# Patient Record
Sex: Female | Born: 1960 | Race: White | Hispanic: No | Marital: Married | Smoking: Former smoker
Health system: Southern US, Community
[De-identification: ages and names within clinical notes are randomized; demographics above are authoritative.]

## PROBLEM LIST (undated history)

## (undated) DIAGNOSIS — R519 Headache, unspecified: Secondary | ICD-10-CM

## (undated) DIAGNOSIS — E785 Hyperlipidemia, unspecified: Secondary | ICD-10-CM

## (undated) DIAGNOSIS — M797 Fibromyalgia: Secondary | ICD-10-CM

## (undated) DIAGNOSIS — R112 Nausea with vomiting, unspecified: Secondary | ICD-10-CM

## (undated) DIAGNOSIS — I10 Essential (primary) hypertension: Secondary | ICD-10-CM

## (undated) DIAGNOSIS — E039 Hypothyroidism, unspecified: Secondary | ICD-10-CM

## (undated) DIAGNOSIS — G7000 Myasthenia gravis without (acute) exacerbation: Secondary | ICD-10-CM

## (undated) DIAGNOSIS — R51 Headache: Secondary | ICD-10-CM

## (undated) HISTORY — DX: Hyperlipidemia, unspecified: E78.5

## (undated) HISTORY — DX: Headache: R51

## (undated) HISTORY — PX: MASTECTOMY: SHX3

## (undated) HISTORY — PX: BREAST SURGERY: SHX581

## (undated) HISTORY — DX: Essential (primary) hypertension: I10

## (undated) HISTORY — PX: REDUCTION MAMMAPLASTY: SUR839

## (undated) HISTORY — DX: Fibromyalgia: M79.7

## (undated) HISTORY — DX: Hypothyroidism, unspecified: E03.9

## (undated) HISTORY — DX: Headache, unspecified: R51.9

---

## 2006-05-30 DIAGNOSIS — D0512 Intraductal carcinoma in situ of left breast: Secondary | ICD-10-CM

## 2006-05-30 HISTORY — DX: Intraductal carcinoma in situ of left breast: D05.12

## 2006-10-12 ENCOUNTER — Encounter: Admission: RE | Admit: 2006-10-12 | Discharge: 2006-10-12 | Payer: Self-pay | Admitting: Obstetrics and Gynecology

## 2006-10-31 ENCOUNTER — Encounter (INDEPENDENT_AMBULATORY_CARE_PROVIDER_SITE_OTHER): Payer: Self-pay | Admitting: Diagnostic Radiology

## 2006-10-31 ENCOUNTER — Encounter: Admission: RE | Admit: 2006-10-31 | Discharge: 2006-10-31 | Payer: Self-pay | Admitting: Obstetrics and Gynecology

## 2006-11-09 ENCOUNTER — Encounter: Admission: RE | Admit: 2006-11-09 | Discharge: 2006-11-09 | Payer: Self-pay | Admitting: Obstetrics and Gynecology

## 2006-11-16 ENCOUNTER — Encounter (INDEPENDENT_AMBULATORY_CARE_PROVIDER_SITE_OTHER): Payer: Self-pay | Admitting: Diagnostic Radiology

## 2006-11-16 ENCOUNTER — Encounter: Admission: RE | Admit: 2006-11-16 | Discharge: 2006-11-16 | Payer: Self-pay | Admitting: General Surgery

## 2006-12-15 ENCOUNTER — Encounter (INDEPENDENT_AMBULATORY_CARE_PROVIDER_SITE_OTHER): Payer: Self-pay | Admitting: General Surgery

## 2006-12-15 ENCOUNTER — Ambulatory Visit (HOSPITAL_COMMUNITY): Admission: RE | Admit: 2006-12-15 | Discharge: 2006-12-16 | Payer: Self-pay | Admitting: General Surgery

## 2007-01-09 ENCOUNTER — Ambulatory Visit: Payer: Self-pay | Admitting: Oncology

## 2007-02-08 LAB — CBC WITH DIFFERENTIAL/PLATELET
BASO%: 0.3 % (ref 0.0–2.0)
Basophils Absolute: 0 10*3/uL (ref 0.0–0.1)
MONO#: 0.5 10*3/uL (ref 0.1–0.9)
MONO%: 5.6 % (ref 0.0–13.0)
NEUT#: 4 10*3/uL (ref 1.5–6.5)
NEUT%: 46.7 % (ref 39.6–76.8)
RDW: 12.3 % (ref 11.3–14.5)
WBC: 8.5 10*3/uL (ref 3.9–10.0)
lymph#: 3.9 10*3/uL — ABNORMAL HIGH (ref 0.9–3.3)

## 2007-02-08 LAB — COMPREHENSIVE METABOLIC PANEL
ALT: 11 U/L (ref 0–35)
AST: 13 U/L (ref 0–37)
Albumin: 4.7 g/dL (ref 3.5–5.2)
CO2: 21 mEq/L (ref 19–32)
Calcium: 9.4 mg/dL (ref 8.4–10.5)
Chloride: 108 mEq/L (ref 96–112)
Creatinine, Ser: 0.93 mg/dL (ref 0.40–1.20)
Total Protein: 7.1 g/dL (ref 6.0–8.3)

## 2007-02-20 ENCOUNTER — Ambulatory Visit: Admission: RE | Admit: 2007-02-20 | Discharge: 2007-03-27 | Payer: Self-pay | Admitting: Radiation Oncology

## 2007-04-09 ENCOUNTER — Ambulatory Visit: Payer: Self-pay | Admitting: Oncology

## 2007-05-21 ENCOUNTER — Ambulatory Visit: Payer: Self-pay | Admitting: Oncology

## 2007-09-13 IMAGING — CR DG CHEST 2V
2 series · 2 of 2 positions shown · non-contrast
Comparison: None.

CLINICAL DATA: Pre-admit for left breast cancer.
 CHEST - 2 VIEW:

[view not recorded (1 of 2)]
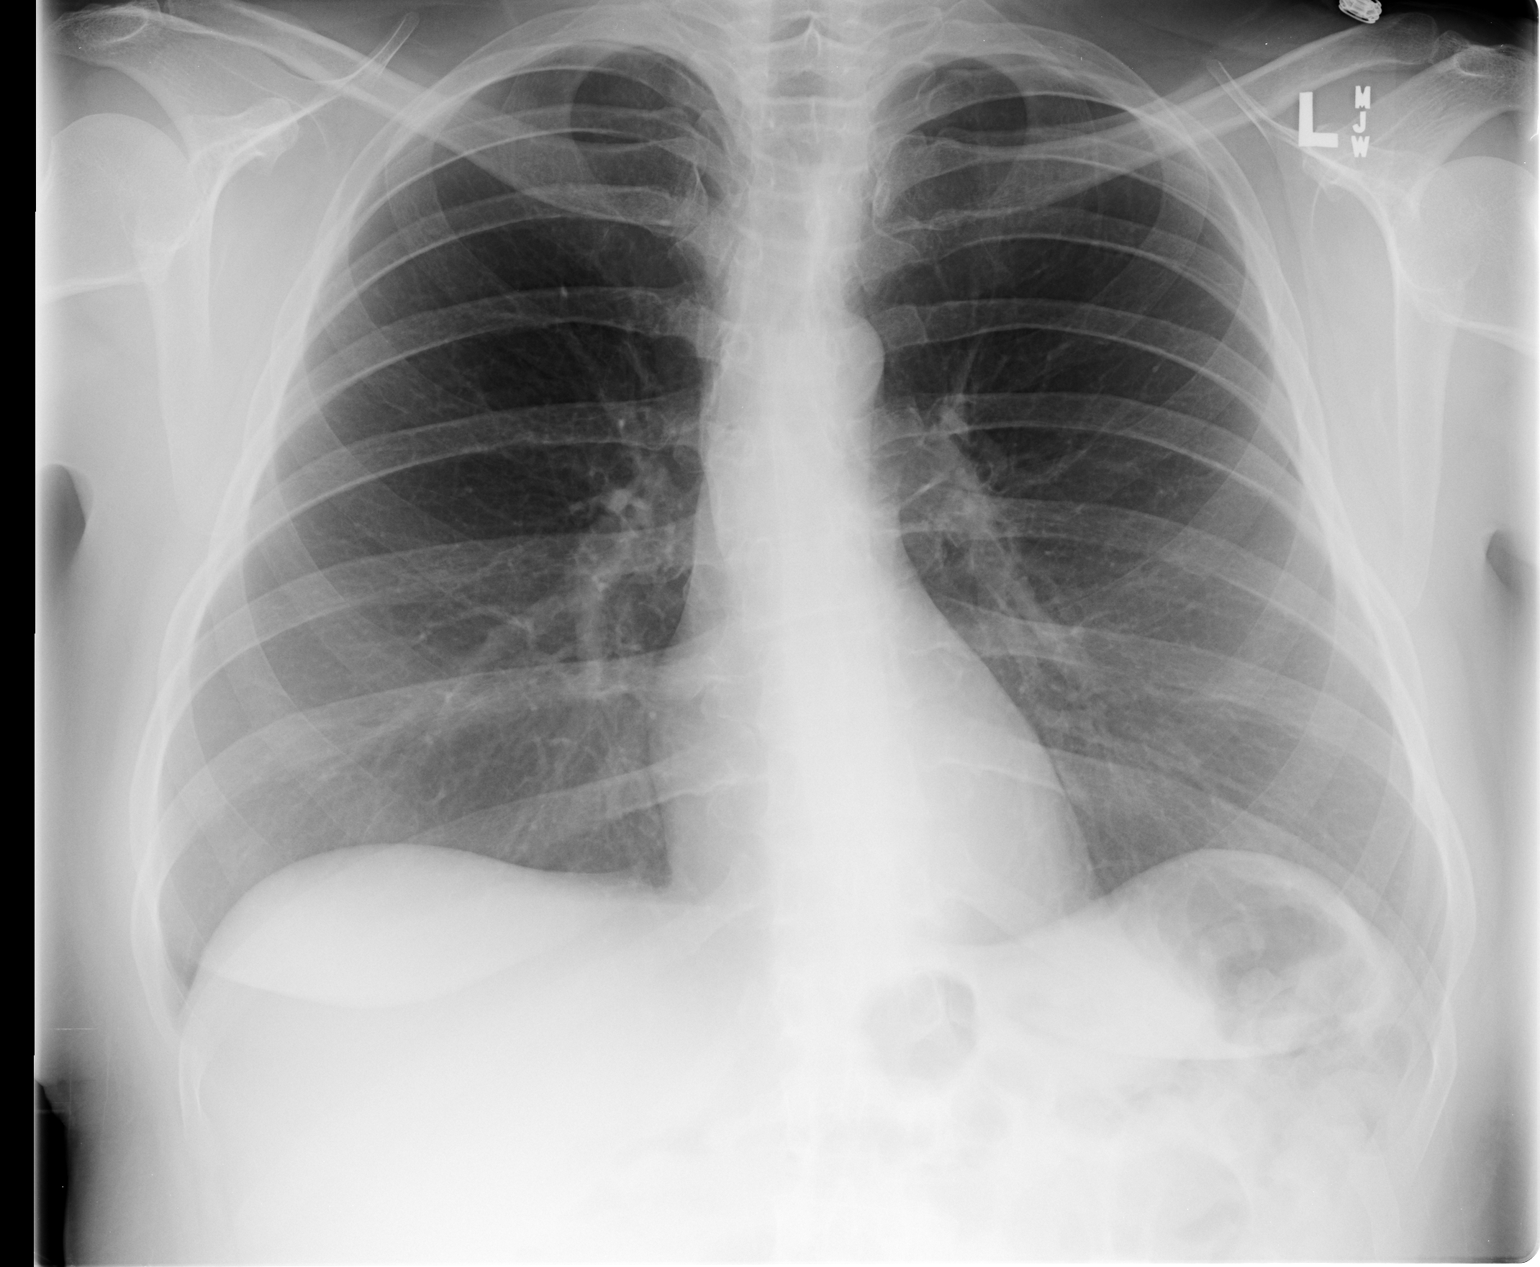

[view not recorded (2 of 2)]
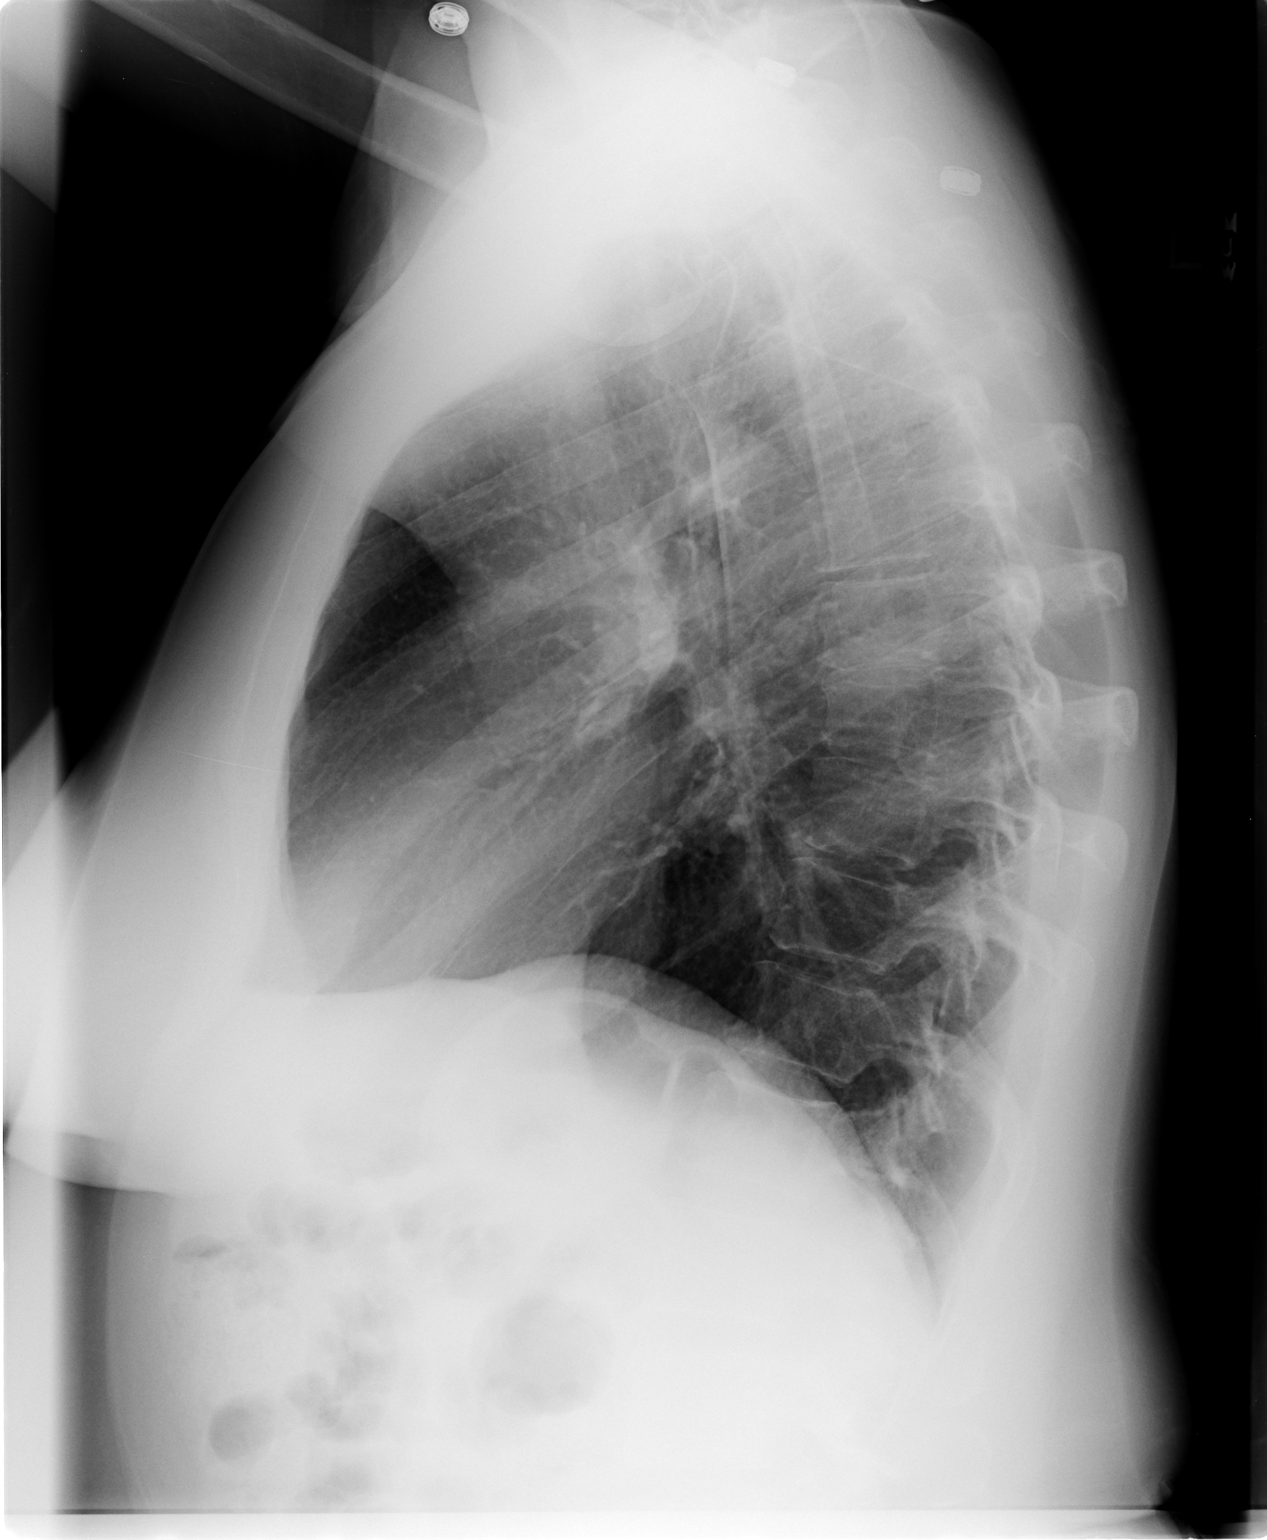

[2 of 2 positions shown; findings below may reference images not displayed]

FINDINGS: The heart size and mediastinal contours are within normal limits.  Both lungs are clear.  The visualized skeletal structures are unremarkable.
IMPRESSION: No active cardiopulmonary disease.

## 2007-11-01 ENCOUNTER — Encounter: Admission: RE | Admit: 2007-11-01 | Discharge: 2007-11-01 | Payer: Self-pay | Admitting: Obstetrics and Gynecology

## 2008-11-10 ENCOUNTER — Encounter: Admission: RE | Admit: 2008-11-10 | Discharge: 2008-11-10 | Payer: Self-pay | Admitting: Obstetrics and Gynecology

## 2009-11-17 ENCOUNTER — Encounter: Admission: RE | Admit: 2009-11-17 | Discharge: 2009-11-17 | Payer: Self-pay | Admitting: Obstetrics and Gynecology

## 2010-10-12 NOTE — Op Note (Signed)
NAME:  Chelsey Thompson, Chelsey Thompson                ACCOUNT NO.:  [id number]   MEDICAL RECORD NO.:  [id number]          PATIENT TYPE:  AMB   LOCATION:  SDS                          FACILITY:  [facility]   PHYSICIAN:  Ollen Gross. Vernell Morgans, M.D. DATE OF BIRTH:  02/19/1961   DATE OF PROCEDURE:  12/15/2006  DATE OF DISCHARGE:                               OPERATIVE REPORT   PREOPERATIVE DIAGNOSIS:  Left breast ductal carcinoma in situ,  extensive.   POSTOPERATIVE DIAGNOSIS:  Left breast ductal carcinoma in situ,  extensive.   PROCEDURE:  Left mastectomy with sentinel node biopsy x2 and injection  of blue dye.   SURGEON:  Ollen Gross. Vernell Morgans, M.D.   ANESTHESIA:  General.   PROCEDURE IN DETAIL:  After informed consent was obtained, the patient  was brought to the operating room, and placed in the supine position on  the operating table.  After induction of general anesthesia, the  patient's left breast and axilla were prepped with Betadine and draped  in the usual sterile manner.  2 mL of methylene blue and 3 mL of  injectable saline were then injected in the subareolar position.  Earlier in the day, the patient had undergone injection of 1 mCi of  technetium sulfur colloid in the subareolar area.  The breast was  massaged for several minutes.  Using the NeoProbe, a hot spot was  identified in the left axilla.  A small transverse incision was made  overlying this hot spot.  This incision was carried down through the  skin and subcutaneous tissue sharply with electrocautery until the  axilla was entered.  Blunt dissection was then carried out as directed  by the NeoProbe until a lymph node was identified.  It was hot but not  blue and had counts of about 150. It was dissected from the rest of the  axillary tissue by sharp dissection with electrocautery and sent to  pathology as sentinel node #1. Another lymph node was then identified,  it was hot and blue, it was separated from the rest of the subcutaneous  tissue by a combination of sharp dissection with electrocautery and then  the lymphatics were clamped with hemostats, divided and ligated with 3-0  Vicryl ties.  Once this was accomplished, there was no further activity  appreciable in the axilla and no palpable adenopathy. The deep layer of  the incision was closed with interrupted 3-0 Vicryl stitches and the  skin was closed with a running 4-0 Monocryl subcuticular stitch.   Next, attention was turned to the left breast.  An elliptical incision  was made around the nipple areolar complex.  This incision was carried  down through the skin and subcutaneous tissue sharply with  electrocautery.  Breast hooks were then used to elevate the skin flaps  superiorly and inferiorly towards the ceiling.  Thin skin flaps were  then created by sharp dissection with electrocautery and gentle traction  on the breast.  The skin flaps were taken all the way down to the chest  wall circumferentially.  Once this was accomplished, the breast was then  removed from the chest wall along with the pectoralis fascia sharply  with electrocautery.  The breast was then divided at the lateral aspect  of the axilla and removed. A stitch was used to mark the lateral aspect  of the breast and the breast was sent to pathology.  The wound was  irrigated with copious amounts of saline.  Hemostasis was achieved using  Bovie electrocautery.  A small stab incision was made in the anterior  axillary line just below the operative field.  A hemostat was brought  through this opening and a 19 [place] round Blake drain was brought  through this opening into the field.  The drain was placed along the  chest wall.  The chest wall was anchored to the pectoralis muscle with  interrupted 3-0 Vicryl stitch.  The superior and inferior edges of the  flaps were then also reapproximated with interrupted 3-0 Vicryl stitches  and the skin was closed with a running 4-0 Monocryl subcuticular  stitch.   A Dermabond dressing was applied and a drain was anchored to the skin  with a 3-0 nylon stitch and placed to bulb suction.  At the end of the  case, all needle, sponge, and instrument counts were correct.  The  patient was then awakened and taken to recovery in stable condition.  Touch preps on the lymph nodes were negative.      Ollen Gross. Vernell Morgans, M.D.  Electronically Signed     PST/MEDQ  D:  12/15/2006  T:  12/15/2006  Job:  [id number]

## 2010-11-12 ENCOUNTER — Other Ambulatory Visit: Payer: Self-pay | Admitting: Obstetrics and Gynecology

## 2010-11-12 DIAGNOSIS — Z9012 Acquired absence of left breast and nipple: Secondary | ICD-10-CM

## 2010-12-06 ENCOUNTER — Ambulatory Visit
Admission: RE | Admit: 2010-12-06 | Discharge: 2010-12-06 | Disposition: A | Discharge status: Home / Self Care | Payer: BC Managed Care – PPO | Source: Ambulatory Visit | Attending: Obstetrics and Gynecology | Admitting: Obstetrics and Gynecology

## 2010-12-06 DIAGNOSIS — Z9012 Acquired absence of left breast and nipple: Secondary | ICD-10-CM

## 2011-03-14 LAB — DIFFERENTIAL
Basophils Relative: 0
Eosinophils Absolute: 0.1
Lymphocytes Relative: 43
Monocytes Absolute: 0.4
Monocytes Relative: 5

## 2011-03-14 LAB — COMPREHENSIVE METABOLIC PANEL
Alkaline Phosphatase: 51
BUN: 9
Chloride: 106
Creatinine, Ser: 0.89
GFR calc non Af Amer: >60
Total Protein: 6.6

## 2011-03-14 LAB — CBC
Hemoglobin: 14.1
RBC: 4.4

## 2011-07-25 ENCOUNTER — Other Ambulatory Visit: Payer: Self-pay | Admitting: Obstetrics and Gynecology

## 2011-07-25 DIAGNOSIS — N63 Unspecified lump in unspecified breast: Secondary | ICD-10-CM

## 2011-07-25 DIAGNOSIS — Z9012 Acquired absence of left breast and nipple: Secondary | ICD-10-CM

## 2011-07-27 ENCOUNTER — Ambulatory Visit
Admission: RE | Admit: 2011-07-27 | Discharge: 2011-07-27 | Disposition: A | Discharge status: Home / Self Care | Payer: BC Managed Care – PPO | Source: Ambulatory Visit | Attending: Obstetrics and Gynecology | Admitting: Obstetrics and Gynecology

## 2011-07-27 DIAGNOSIS — N63 Unspecified lump in unspecified breast: Secondary | ICD-10-CM

## 2011-07-27 DIAGNOSIS — Z9012 Acquired absence of left breast and nipple: Secondary | ICD-10-CM

## 2012-10-29 ENCOUNTER — Other Ambulatory Visit: Payer: Self-pay

## 2012-10-29 DIAGNOSIS — Z1231 Encounter for screening mammogram for malignant neoplasm of breast: Secondary | ICD-10-CM

## 2012-10-29 DIAGNOSIS — Z9012 Acquired absence of left breast and nipple: Secondary | ICD-10-CM

## 2012-10-29 DIAGNOSIS — Z853 Personal history of malignant neoplasm of breast: Secondary | ICD-10-CM

## 2012-12-17 ENCOUNTER — Ambulatory Visit
Admission: RE | Admit: 2012-12-17 | Discharge: 2012-12-17 | Disposition: A | Discharge status: Home / Self Care | Payer: BC Managed Care – PPO | Source: Ambulatory Visit

## 2012-12-17 DIAGNOSIS — Z1231 Encounter for screening mammogram for malignant neoplasm of breast: Secondary | ICD-10-CM

## 2012-12-17 DIAGNOSIS — Z853 Personal history of malignant neoplasm of breast: Secondary | ICD-10-CM

## 2012-12-17 DIAGNOSIS — Z9012 Acquired absence of left breast and nipple: Secondary | ICD-10-CM

## 2013-08-28 ENCOUNTER — Telehealth: Payer: Self-pay | Admitting: General Practice

## 2013-08-28 NOTE — Telephone Encounter (Signed)
Pt aware  And apt made

## 2013-09-02 ENCOUNTER — Encounter: Payer: Self-pay | Admitting: General Practice

## 2013-09-02 ENCOUNTER — Ambulatory Visit (INDEPENDENT_AMBULATORY_CARE_PROVIDER_SITE_OTHER): Payer: BC Managed Care – PPO | Admitting: General Practice

## 2013-09-02 VITALS — BP 120/80 | HR 72 | Temp 97.0°F | Ht 64.75 in | Wt 184.2 lb

## 2013-09-02 DIAGNOSIS — I10 Essential (primary) hypertension: Secondary | ICD-10-CM

## 2013-09-02 DIAGNOSIS — R519 Headache, unspecified: Secondary | ICD-10-CM | POA: Insufficient documentation

## 2013-09-02 DIAGNOSIS — R51 Headache: Secondary | ICD-10-CM | POA: Insufficient documentation

## 2013-09-02 LAB — POCT CBC
GRANULOCYTE PERCENT: 52.7 % (ref 37–80)
HEMATOCRIT: 40 % (ref 37.7–47.9)
Hemoglobin: 13 g/dL (ref 12.2–16.2)
Lymph, poc: 2.3 (ref 0.6–3.4)
MCH: 30.1 pg (ref 27–31.2)
MCHC: 32.4 g/dL (ref 31.8–35.4)
MCV: 93 fL (ref 80–97)
MPV: 9.8 fL (ref 0–99.8)
POC Granulocyte: 3 (ref 2–6.9)
POC LYMPH PERCENT: 41.2 %L (ref 10–50)
Platelet Count, POC: 222 10*3/uL (ref 142–424)
RBC: 4.3 M/uL (ref 4.04–5.48)
RDW, POC: 12.5 %
WBC: 5.6 10*3/uL (ref 4.6–10.2)

## 2013-09-02 NOTE — Patient Instructions (Signed)
Headaches, Frequently Asked Questions  MIGRAINE HEADACHES  Q: What is migraine? What causes it? How can I treat it?  A: Generally, migraine headaches begin as a dull ache. Then they develop into a constant, throbbing, and pulsating pain. You may experience pain at the temples. You may experience pain at the front or back of one or both sides of the head. The pain is usually accompanied by a combination of:   Nausea.   Vomiting.   Sensitivity to light and noise.  Some people (about 15%) experience an aura (see below) before an attack. The cause of migraine is believed to be chemical reactions in the brain. Treatment for migraine may include over-the-counter or prescription medications. It may also include self-help techniques. These include relaxation training and biofeedback.   Q: What is an aura?  A: About 15% of people with migraine get an "aura". This is a sign of neurological symptoms that occur before a migraine headache. You may see wavy or jagged lines, dots, or flashing lights. You might experience tunnel vision or blind spots in one or both eyes. The aura can include visual or auditory hallucinations (something imagined). It may include disruptions in smell (such as strange odors), taste or touch. Other symptoms include:   Numbness.   A "pins and needles" sensation.   Difficulty in recalling or speaking the correct word.  These neurological events may last as long as 60 minutes. These symptoms will fade as the headache begins.  Q: What is a trigger?  A: Certain physical or environmental factors can lead to or "trigger" a migraine. These include:   Foods.   Hormonal changes.   Weather.   Stress.  It is important to remember that triggers are different for everyone. To help prevent migraine attacks, you need to figure out which triggers affect you. Keep a headache diary. This is a good way to track triggers. The diary will help you talk to your healthcare professional about your condition.  Q: Does  weather affect migraines?  A: Bright sunshine, hot, humid conditions, and drastic changes in barometric pressure may lead to, or "trigger," a migraine attack in some people. But studies have shown that weather does not act as a trigger for everyone with migraines.  Q: What is the link between migraine and hormones?  A: Hormones start and regulate many of your body's functions. Hormones keep your body in balance within a constantly changing environment. The levels of hormones in your body are unbalanced at times. Examples are during menstruation, pregnancy, or menopause. That can lead to a migraine attack. In fact, about three quarters of all women with migraine report that their attacks are related to the menstrual cycle.   Q: Is there an increased risk of stroke for migraine sufferers?  A: The likelihood of a migraine attack causing a stroke is very remote. That is not to say that migraine sufferers cannot have a stroke associated with their migraines. In persons under age 40, the most common associated factor for stroke is migraine headache. But over the course of a person's normal life span, the occurrence of migraine headache may actually be associated with a reduced risk of dying from cerebrovascular disease due to stroke.   Q: What are acute medications for migraine?  A: Acute medications are used to treat the pain of the headache after it has started. Examples over-the-counter medications, NSAIDs, ergots, and triptans.   Q: What are the triptans?  A: Triptans are the newest class of   abortive medications. They are specifically targeted to treat migraine. Triptans are vasoconstrictors. They moderate some chemical reactions in the brain. The triptans work on receptors in your brain. Triptans help to restore the balance of a neurotransmitter called serotonin. Fluctuations in levels of serotonin are thought to be a main cause of migraine.   Q: Are over-the-counter medications for migraine effective?  A:  Over-the-counter, or "OTC," medications may be effective in relieving mild to moderate pain and associated symptoms of migraine. But you should see your caregiver before beginning any treatment regimen for migraine.   Q: What are preventive medications for migraine?  A: Preventive medications for migraine are sometimes referred to as "prophylactic" treatments. They are used to reduce the frequency, severity, and length of migraine attacks. Examples of preventive medications include antiepileptic medications, antidepressants, beta-blockers, calcium channel blockers, and NSAIDs (nonsteroidal anti-inflammatory drugs).  Q: Why are anticonvulsants used to treat migraine?  A: During the past few years, there has been an increased interest in antiepileptic drugs for the prevention of migraine. They are sometimes referred to as "anticonvulsants". Both epilepsy and migraine may be caused by similar reactions in the brain.   Q: Why are antidepressants used to treat migraine?  A: Antidepressants are typically used to treat people with depression. They may reduce migraine frequency by regulating chemical levels, such as serotonin, in the brain.   Q: What alternative therapies are used to treat migraine?  A: The term "alternative therapies" is often used to describe treatments considered outside the scope of conventional Western medicine. Examples of alternative therapy include acupuncture, acupressure, and yoga. Another common alternative treatment is herbal therapy. Some herbs are believed to relieve headache pain. Always discuss alternative therapies with your caregiver before proceeding. Some herbal products contain arsenic and other toxins.  TENSION HEADACHES  Q: What is a tension-type headache? What causes it? How can I treat it?  A: Tension-type headaches occur randomly. They are often the result of temporary stress, anxiety, fatigue, or anger. Symptoms include soreness in your temples, a tightening band-like sensation  around your head (a "vice-like" ache). Symptoms can also include a pulling feeling, pressure sensations, and contracting head and neck muscles. The headache begins in your forehead, temples, or the back of your head and neck. Treatment for tension-type headache may include over-the-counter or prescription medications. Treatment may also include self-help techniques such as relaxation training and biofeedback.  CLUSTER HEADACHES  Q: What is a cluster headache? What causes it? How can I treat it?  A: Cluster headache gets its name because the attacks come in groups. The pain arrives with little, if any, warning. It is usually on one side of the head. A tearing or bloodshot eye and a runny nose on the same side of the headache may also accompany the pain. Cluster headaches are believed to be caused by chemical reactions in the brain. They have been described as the most severe and intense of any headache type. Treatment for cluster headache includes prescription medication and oxygen.  SINUS HEADACHES  Q: What is a sinus headache? What causes it? How can I treat it?  A: When a cavity in the bones of the face and skull (a sinus) becomes inflamed, the inflammation will cause localized pain. This condition is usually the result of an allergic reaction, a tumor, or an infection. If your headache is caused by a sinus blockage, such as an infection, you will probably have a fever. An x-ray will confirm a sinus blockage. Your caregiver's   treatment might include antibiotics for the infection, as well as antihistamines or decongestants.   REBOUND HEADACHES  Q: What is a rebound headache? What causes it? How can I treat it?  A: A pattern of taking acute headache medications too often can lead to a condition known as "rebound headache." A pattern of taking too much headache medication includes taking it more than 2 days per week or in excessive amounts. That means more than the label or a caregiver advises. With rebound  headaches, your medications not only stop relieving pain, they actually begin to cause headaches. Doctors treat rebound headache by tapering the medication that is being overused. Sometimes your caregiver will gradually substitute a different type of treatment or medication. Stopping may be a challenge. Regularly overusing a medication increases the potential for serious side effects. Consult a caregiver if you regularly use headache medications more than 2 days per week or more than the label advises.  ADDITIONAL QUESTIONS AND ANSWERS  Q: What is biofeedback?  A: Biofeedback is a self-help treatment. Biofeedback uses special equipment to monitor your body's involuntary physical responses. Biofeedback monitors:   Breathing.   Pulse.   Heart rate.   Temperature.   Muscle tension.   Brain activity.  Biofeedback helps you refine and perfect your relaxation exercises. You learn to control the physical responses that are related to stress. Once the technique has been mastered, you do not need the equipment any more.  Q: Are headaches hereditary?  A: Four out of five (80%) of people that suffer report a family history of migraine. Scientists are not sure if this is genetic or a family predisposition. Despite the uncertainty, a child has a 50% chance of having migraine if one parent suffers. The child has a 75% chance if both parents suffer.   Q: Can children get headaches?  A: By the time they reach high school, most young people have experienced some type of headache. Many safe and effective approaches or medications can prevent a headache from occurring or stop it after it has begun.   Q: What type of doctor should I see to diagnose and treat my headache?  A: Start with your primary caregiver. Discuss his or her experience and approach to headaches. Discuss methods of classification, diagnosis, and treatment. Your caregiver may decide to recommend you to a headache specialist, depending upon your symptoms or other  physical conditions. Having diabetes, allergies, etc., may require a more comprehensive and inclusive approach to your headache. The National Head[employer] Foundation will provide, upon request, a list of NHF physician members in your state.  Document Released: 08/06/2003 Document Revised: 08/08/2011 Document Reviewed: 01/14/2008  ExitCare Patient Information 2014 [employer]  Hypertension  As your heart beats, it forces blood through your arteries. This force is your blood pressure. If the pressure is too high, it is called hypertension (HTN) or high blood pressure. HTN is dangerous because you may have it and not know it. High blood pressure may mean that your heart has to work harder to pump blood. Your arteries may be narrow or stiff. The extra work puts you at risk for heart disease, stroke, and other problems.   Blood pressure consists of two numbers, a higher number over a lower, 110/72, for example. It is stated as "110 over 72." The ideal is below 120 for the top number (systolic) and under 80 for the bottom (diastolic). Write down your blood pressure today.  You should pay close attention to your blood   pressure if you have certain conditions such as:   Heart failure.   Prior heart attack.   Diabetes   Chronic kidney disease.   Prior stroke.   Multiple risk factors for heart disease.  To see if you have HTN, your blood pressure should be measured while you are seated with your arm held at the level of the heart. It should be measured at least twice. A one-time elevated blood pressure reading (especially in the Emergency Department) does not mean that you need treatment. There may be conditions in which the blood pressure is different between your right and left arms. It is important to see your caregiver soon for a recheck.  Most people have essential hypertension which means that there is not a specific cause. This type of high blood pressure may be lowered by changing lifestyle factors such  as:   Stress.   Smoking.   Lack of exercise.   Excessive weight.   Drug/tobacco/alcohol use.   Eating less salt.  Most people do not have symptoms from high blood pressure until it has caused damage to the body. Effective treatment can often prevent, delay or reduce that damage.  TREATMENT   When a cause has been identified, treatment for high blood pressure is directed at the cause. There are a large number of medications to treat HTN. These fall into several categories, and your caregiver will help you select the medicines that are best for you. Medications may have side effects. You should review side effects with your caregiver.  If your blood pressure stays high after you have made lifestyle changes or started on medicines,    Your medication(s) may need to be changed.   Other problems may need to be addressed.   Be certain you understand your prescriptions, and know how and when to take your medicine.   Be sure to follow up with your caregiver within the time frame advised (usually within two weeks) to have your blood pressure rechecked and to review your medications.   If you are taking more than one medicine to lower your blood pressure, make sure you know how and at what times they should be taken. Taking two medicines at the same time can result in blood pressure that is too low.  SEEK IMMEDIATE MEDICAL CARE IF:   You develop a severe headache, blurred or changing vision, or confusion.   You have unusual weakness or numbness, or a faint feeling.   You have severe chest or abdominal pain, vomiting, or breathing problems.  MAKE SURE YOU:    Understand these instructions.   Will watch your condition.   Will get help right away if you are not doing well or get worse.  Document Released: 05/16/2005 Document Revised: 08/08/2011 Document Reviewed: 01/04/2008  Exit[employer] Patient Information 2014 [employer].

## 2013-09-02 NOTE — Progress Notes (Signed)
   Subjective:    Patient ID: Chelsey Thompson, female    DOB: 04/24/1961, 53 y.o.   MRN: [id number]  HPI Patient presents today to establish care. She was being seen by a internal medicine practice in [place]. History of hypertension and headaches (onset 1.5 years ago and gradually worsening). History of hyperlipidemia, but verbalized she will not take an alternative. Taking Lotrel as prescribed. Denies currently taking prescribed medications for headache. Has taking Topamax in the past, but stopped taking due to feeling like it was ineffective. Also has taking several prescription medications to address headaches, without effectiveness. 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 Reports drinking two large cups of caffeine drinks daily. Walks 30 minutes daily for exercise. Denies being seen by a headache specialist.     Review of Systems  Constitutional: Negative for fever and chills.  Respiratory: Negative for chest tightness and shortness of breath.   Cardiovascular: Negative for chest pain and palpitations.  Gastrointestinal: Negative for nausea, vomiting, abdominal pain, diarrhea, constipation and blood in stool.  Genitourinary: Negative for difficulty urinating.  Neurological: Negative for dizziness, weakness and headaches.       Objective:   Physical Exam  Constitutional: She is oriented to person, place, and time. She appears well-developed and well-nourished.  HENT:  Head: Normocephalic and atraumatic.  Right Ear: External ear normal.  Left Ear: External ear normal.  Mouth/Throat: Oropharynx is clear and moist.  Eyes: EOM are normal. Pupils are equal, round, and reactive to light.  Neck: Normal range of motion. Neck supple. No thyromegaly present.  Cardiovascular: Normal rate, regular rhythm and normal heart sounds.   Pulmonary/Chest: Effort normal and breath sounds normal. No respiratory distress. She exhibits no tenderness.  Abdominal: Soft. Bowel sounds  are normal. She exhibits no distension. There is no tenderness.  Lymphadenopathy:    She has no cervical adenopathy.  Neurological: She is alert and oriented to person, place, and time.  Skin: Skin is warm and dry.  Psychiatric: She has a normal mood and affect.          Assessment & Plan:  1. Hypertension  - POCT CBC - CMP14+EGFR   2. Headache(784.0)  - AMB referral to headache clinic -maintain headache and blood pressure diary -bring to office on next visit -Continue all current medications Labs pending F/u in 3 months Discussed benefits of regular exercise and healthy eating Sign release for medical records Patient verbalized understanding Erby Pian, FNP-C

## 2013-09-03 LAB — CMP14+EGFR
ALT: 12 IU/L (ref 0–32)
AST: 13 IU/L (ref 0–40)
Albumin/Globulin Ratio: 2.9 — ABNORMAL HIGH (ref 1.1–2.5)
Albumin: 5 g/dL (ref 3.5–5.5)
Alkaline Phosphatase: 64 IU/L (ref 39–117)
BUN / CREAT RATIO: 8 — AB (ref 9–23)
BUN: 7 mg/dL (ref 6–24)
CO2: 22 mmol/L (ref 18–29)
CREATININE: 0.89 mg/dL (ref 0.57–1.00)
Calcium: 9.8 mg/dL (ref 8.7–10.2)
Chloride: 103 mmol/L (ref 97–108)
GFR calc non Af Amer: 76 mL/min/{1.73_m2} (ref >59)
GFR, EST AFRICAN AMERICAN: 87 mL/min/{1.73_m2} (ref >59)
Globulin, Total: 1.7 g/dL (ref 1.5–4.5)
Glucose: 88 mg/dL (ref 65–99)
Potassium: 4.9 mmol/L (ref 3.5–5.2)
Sodium: 139 mmol/L (ref 134–144)
Total Bilirubin: 0.4 mg/dL (ref 0.0–1.2)
Total Protein: 6.7 g/dL (ref 6.0–8.5)

## 2013-11-14 ENCOUNTER — Other Ambulatory Visit: Payer: Self-pay

## 2013-11-14 DIAGNOSIS — Z9012 Acquired absence of left breast and nipple: Secondary | ICD-10-CM

## 2013-11-14 DIAGNOSIS — Z853 Personal history of malignant neoplasm of breast: Secondary | ICD-10-CM

## 2013-11-14 DIAGNOSIS — Z1231 Encounter for screening mammogram for malignant neoplasm of breast: Secondary | ICD-10-CM

## 2013-12-23 ENCOUNTER — Ambulatory Visit
Admission: RE | Admit: 2013-12-23 | Discharge: 2013-12-23 | Disposition: A | Discharge status: Home / Self Care | Payer: BC Managed Care – PPO | Source: Ambulatory Visit

## 2013-12-23 DIAGNOSIS — Z9012 Acquired absence of left breast and nipple: Secondary | ICD-10-CM

## 2013-12-23 DIAGNOSIS — Z1231 Encounter for screening mammogram for malignant neoplasm of breast: Secondary | ICD-10-CM

## 2013-12-23 DIAGNOSIS — Z853 Personal history of malignant neoplasm of breast: Secondary | ICD-10-CM

## 2014-01-02 ENCOUNTER — Other Ambulatory Visit: Payer: Self-pay | Admitting: Specialist

## 2014-01-02 DIAGNOSIS — G43109 Migraine with aura, not intractable, without status migrainosus: Secondary | ICD-10-CM

## 2014-01-02 DIAGNOSIS — G8929 Other chronic pain: Secondary | ICD-10-CM

## 2014-01-02 DIAGNOSIS — R51 Headache: Secondary | ICD-10-CM

## 2014-01-14 ENCOUNTER — Ambulatory Visit
Admission: RE | Admit: 2014-01-14 | Discharge: 2014-01-14 | Disposition: A | Discharge status: Home / Self Care | Payer: BC Managed Care – PPO | Source: Ambulatory Visit | Attending: Specialist | Admitting: Specialist

## 2014-01-14 DIAGNOSIS — G8929 Other chronic pain: Secondary | ICD-10-CM

## 2014-01-14 DIAGNOSIS — G43109 Migraine with aura, not intractable, without status migrainosus: Secondary | ICD-10-CM

## 2014-01-14 DIAGNOSIS — R51 Headache: Secondary | ICD-10-CM

## 2014-01-14 MED ORDER — GADOBENATE DIMEGLUMINE 529 MG/ML IV SOLN
17.0000 mL | Freq: Once | INTRAVENOUS | Status: AC | PRN
  Administered 2014-01-14: 17 mL via INTRAVENOUS

## 2014-11-05 ENCOUNTER — Other Ambulatory Visit: Payer: Self-pay

## 2014-11-05 DIAGNOSIS — Z9012 Acquired absence of left breast and nipple: Secondary | ICD-10-CM

## 2014-11-05 DIAGNOSIS — Z853 Personal history of malignant neoplasm of breast: Secondary | ICD-10-CM

## 2014-11-05 DIAGNOSIS — Z1231 Encounter for screening mammogram for malignant neoplasm of breast: Secondary | ICD-10-CM

## 2014-12-15 ENCOUNTER — Encounter: Payer: Self-pay | Admitting: Genetic Counselor

## 2014-12-25 ENCOUNTER — Ambulatory Visit: Payer: Self-pay

## 2015-02-23 ENCOUNTER — Ambulatory Visit
Admission: RE | Admit: 2015-02-23 | Discharge: 2015-02-23 | Disposition: A | Discharge status: Home / Self Care | Payer: BLUE CROSS/BLUE SHIELD | Source: Ambulatory Visit

## 2015-02-23 DIAGNOSIS — Z9012 Acquired absence of left breast and nipple: Secondary | ICD-10-CM

## 2015-02-23 DIAGNOSIS — Z853 Personal history of malignant neoplasm of breast: Secondary | ICD-10-CM

## 2015-02-23 DIAGNOSIS — Z1231 Encounter for screening mammogram for malignant neoplasm of breast: Secondary | ICD-10-CM

## 2016-02-03 ENCOUNTER — Other Ambulatory Visit: Payer: Self-pay | Admitting: Plastic Surgery

## 2016-02-03 DIAGNOSIS — Z1231 Encounter for screening mammogram for malignant neoplasm of breast: Secondary | ICD-10-CM

## 2016-02-03 DIAGNOSIS — R6889 Other general symptoms and signs: Secondary | ICD-10-CM

## 2016-02-16 ENCOUNTER — Other Ambulatory Visit: Payer: Self-pay | Admitting: Plastic Surgery

## 2016-02-16 DIAGNOSIS — R6889 Other general symptoms and signs: Secondary | ICD-10-CM

## 2016-03-07 ENCOUNTER — Other Ambulatory Visit: Payer: BLUE CROSS/BLUE SHIELD

## 2016-03-07 ENCOUNTER — Ambulatory Visit
Admission: RE | Admit: 2016-03-07 | Discharge: 2016-03-07 | Disposition: A | Discharge status: Home / Self Care | Payer: BLUE CROSS/BLUE SHIELD | Source: Ambulatory Visit | Attending: Plastic Surgery | Admitting: Plastic Surgery

## 2016-03-07 DIAGNOSIS — R6889 Other general symptoms and signs: Secondary | ICD-10-CM

## 2016-09-29 ENCOUNTER — Emergency Department (HOSPITAL_COMMUNITY): Payer: BLUE CROSS/BLUE SHIELD

## 2016-09-29 ENCOUNTER — Encounter (HOSPITAL_COMMUNITY): Payer: Self-pay

## 2016-09-29 ENCOUNTER — Emergency Department (HOSPITAL_COMMUNITY)
Admission: EM | Admit: 2016-09-29 | Discharge: 2016-09-29 | Disposition: A | Discharge status: Home / Self Care | Payer: BLUE CROSS/BLUE SHIELD | Attending: Emergency Medicine | Admitting: Emergency Medicine

## 2016-09-29 DIAGNOSIS — Y939 Activity, unspecified: Secondary | ICD-10-CM | POA: Diagnosis not present

## 2016-09-29 DIAGNOSIS — Y929 Unspecified place or not applicable: Secondary | ICD-10-CM | POA: Diagnosis not present

## 2016-09-29 DIAGNOSIS — Z87891 Personal history of nicotine dependence: Secondary | ICD-10-CM | POA: Insufficient documentation

## 2016-09-29 DIAGNOSIS — W228XXA Striking against or struck by other objects, initial encounter: Secondary | ICD-10-CM | POA: Insufficient documentation

## 2016-09-29 DIAGNOSIS — S0990XA Unspecified injury of head, initial encounter: Secondary | ICD-10-CM | POA: Insufficient documentation

## 2016-09-29 DIAGNOSIS — Y999 Unspecified external cause status: Secondary | ICD-10-CM | POA: Diagnosis not present

## 2016-09-29 DIAGNOSIS — F0781 Postconcussional syndrome: Secondary | ICD-10-CM | POA: Diagnosis not present

## 2016-09-29 MED ORDER — HYDROCODONE-ACETAMINOPHEN 5-325 MG PO TABS: 1.0000 | ORAL_TABLET | Freq: Four times a day (QID) | ORAL | 0 refills | Status: DC | PRN

## 2016-09-29 NOTE — ED Provider Notes (Signed)
[facility]-EMER[facility]ENCY DEPT Provider Note   CSN: [id number] Arrival date & time: 09/29/16  1428  By signing my name below, I, Chelsey Thompson, attest that this documentation has been prepared under the direction and in the presence of Chelsey Mulders, MD. Electronically Signed: Marnette Burgess Thompson, Scribe. 09/29/2016. 3:37 PM.  History   Chief Complaint Chief Complaint  Patient presents with  . Head Injury/ Wants CT   The history is provided by the patient and medical records. No language interpreter was used.  Injury  This is a new problem. The current episode started more than 2 days ago. The problem occurs hourly. The problem has not changed since onset.Associated symptoms include headaches. Pertinent negatives include no chest pain, no abdominal pain and no shortness of breath. The symptoms are aggravated by coughing. Nothing relieves the symptoms. She has tried nothing for the symptoms. The treatment provided no relief.    HPI Comments:  Chelsey Thompson is a 56 y.o. female with a PMHx of HA's and HTN, who presents to the Emergency Department complaining of persistent, intermittent, 4/10 bilateral temporal HA s/p a head injury 7 days ago. Pt reports striking her right occipital head on a hanging 2x4 wooden board in a chicken coop seven days ago. She saw her PCP s/p the accident and was Dx'd with a concussion. She is seen here today as she has had continued confusion with mild forgetfulness alongside of the 4/10 headaches. Pt requests a CT Head today. Pt has associated symptoms of photophobia, fever, mild visual disturbance, cough, and neck pain qualified as "stiff". No alleviating factors noted. Bright lights and her cough exacerbate her symptoms. Pt denies congestion, rhinorrhea, sore throat, ear pain, tinnitus, SOB, CP, leg swelling, abdominal pain, diarrhea, nausea, vomiting, dysuria, hematuria, back pain, rash, dizziness, numbness, bruising/bleeding easily. Pt is not currently on anticoagulants.    Past Medical History:  Diagnosis Date  . Frequent headaches   . Hypertension    Patient Active Problem List   Diagnosis Date Noted  . Hypertension 09/02/2013  . Headache(784.0) 09/02/2013   Past Surgical History:  Procedure Laterality Date  . BREAST SURGERY Left    Cancer and implant   OB History    No data available     Home Medications    Prior to Admission medications   Medication Sig Start Date End Date Taking? Authorizing Provider  amLODipine-benazepril (LOTREL) 5-20 MG per capsule Take 1 capsule by mouth daily.    Historical Provider, MD  DICLOFENAC PO Take 75 mg by mouth daily.    Historical Provider, MD  HYDROcodone-acetaminophen (NORCO/VICODIN) 5-325 MG tablet Take 1-2 tablets by mouth every 6 (six) hours as needed for moderate pain. 09/29/16   Chelsey Mulders, MD   Family History Family History  Problem Relation Age of Onset  . COPD Mother   . Cancer Father    Social History Social History  Substance Use Topics  . Smoking status: Former Smoker    Quit date: 05/31/1987  . Smokeless tobacco: Never Used  . Alcohol use Yes     Comment: Drank heavily for 14 years.   Allergies   Sulfa antibiotics  Review of Systems Review of Systems  Constitutional: Positive for fever.  HENT: Negative for congestion, ear pain, rhinorrhea, sore throat and tinnitus.   Eyes: Positive for photophobia and visual disturbance.  Respiratory: Positive for cough. Negative for shortness of breath.   Cardiovascular: Negative for chest pain and leg swelling.  Gastrointestinal: Negative for abdominal pain, diarrhea, nausea  and vomiting.  Genitourinary: Negative for dysuria and hematuria.  Musculoskeletal: Positive for neck pain. Negative for back pain.  Skin: Negative for rash.  Neurological: Positive for headaches. Negative for dizziness and numbness.  Hematological: Does not bruise/bleed easily.  Psychiatric/Behavioral: Positive for confusion.       Positive forgetfulness    Physical Exam Updated Vital Signs BP (!) 148/80 (BP Location: Right Arm)   Pulse 71   Temp 97.9 F (36.6 C) (Oral)   Resp 17   SpO2 100%   Physical Exam  Constitutional: She is oriented to person, place, and time. She appears well-developed and well-nourished.  HENT:  Head: Normocephalic.  Mucus membranes moist.   Eyes: Conjunctivae are normal. Pupils are equal, round, and reactive to light.  Pupils normal, sclera clear, eyes tracking normally.   Neck: Normal range of motion.  Cardiovascular: Normal rate, regular rhythm and normal heart sounds.  Exam reveals no gallop and no friction rub.   No murmur heard. No BLE edema.   Pulmonary/Chest: Effort normal and breath sounds normal. No respiratory distress. She has no wheezes. She has no rales. She exhibits no tenderness.  Bilateral lungs clear.   Abdominal: Soft. She exhibits no distension. There is no tenderness.  Musculoskeletal: Normal range of motion.  Neurological: She is alert and oriented to person, place, and time.  Skin: Skin is warm and dry.  Psychiatric: She has a normal mood and affect.  Nursing note and vitals reviewed.   ED Treatments / Results  DIAGNOSTIC STUDIES:  Oxygen Saturation is 96% on RA, normal by my interpretation.    COORDINATION OF CARE:  3:37 PM Discussed treatment plan with pt at bedside including CT Head/Neck and pt agreed to plan. Labs (all labs ordered are listed, but only abnormal results are displayed) Labs Reviewed - No data to display  EKG  EKG Interpretation None       Radiology Ct Head Wo Contrast  Result Date: 09/29/2016 CLINICAL DATA:  Struck head on chicken Coup 1 week ago with headaches since then. Confusion. Posterior neck pain. EXAM: CT HEAD WITHOUT CONTRAST CT CERVICAL SPINE WITHOUT CONTRAST TECHNIQUE: Multidetector CT imaging of the head and cervical spine was performed following the standard protocol without intravenous contrast. Multiplanar CT image reconstructions of  the cervical spine were also generated. COMPARISON:  01/14/2014 FINDINGS: CT HEAD FINDINGS Brain: No evidence of malformation, atrophy, old or acute small or large vessel infarction, mass lesion, hemorrhage, hydrocephalus or extra-axial collection. No evidence of pituitary lesion. Vascular: No vascular calcification.  No hyperdense vessels. Skull: Normal.  No fracture or focal bone lesion. Sinuses/Orbits: Visualized sinuses are clear. No fluid in the middle ears or mastoids. Visualized orbits are normal. Other: None significant CT CERVICAL SPINE FINDINGS Alignment: Normal Skull base and vertebrae: Normal Soft tissues and spinal canal: Normal Disc levels:  Normal Upper chest: Normal Other: None IMPRESSION: Head CT:  Normal. Cervical spine CT:  Normal. Electronically Signed   By: Paulina FusiMark  Shogry M.D.   On: 09/29/2016 16:22   Ct Cervical Spine Wo Contrast  Result Date: 09/29/2016 CLINICAL DATA:  Struck head on chicken Coup 1 week ago with headaches since then. Confusion. Posterior neck pain. EXAM: CT HEAD WITHOUT CONTRAST CT CERVICAL SPINE WITHOUT CONTRAST TECHNIQUE: Multidetector CT imaging of the head and cervical spine was performed following the standard protocol without intravenous contrast. Multiplanar CT image reconstructions of the cervical spine were also generated. COMPARISON:  01/14/2014 FINDINGS: CT HEAD FINDINGS Brain: No evidence of malformation, atrophy, old  or acute small or large vessel infarction, mass lesion, hemorrhage, hydrocephalus or extra-axial collection. No evidence of pituitary lesion. Vascular: No vascular calcification.  No hyperdense vessels. Skull: Normal.  No fracture or focal bone lesion. Sinuses/Orbits: Visualized sinuses are clear. No fluid in the middle ears or mastoids. Visualized orbits are normal. Other: None significant CT CERVICAL SPINE FINDINGS Alignment: Normal Skull base and vertebrae: Normal Soft tissues and spinal canal: Normal Disc levels:  Normal Upper chest: Normal  Other: None IMPRESSION: Head CT:  Normal. Cervical spine CT:  Normal. Electronically Signed   By: Paulina Fusi M.D.   On: 09/29/2016 16:22    Procedures Procedures (including critical care time)  Medications Ordered in ED Medications - No data to display   Initial Impression / Assessment and Plan / ED Course  I have reviewed the triage vital signs and the nursing notes.  Pertinent labs & imaging results that were available during my care of the patient were reviewed by me and considered in my medical decision making (see chart for details).     Patient with head injury a week ago. Symptoms consistent with concussion. Head CT CT neck without any acute findings. Patient will be put on brain rest pulled from work. Follow-up with neurology. Patient nontoxic no acute distress.  Final Clinical Impressions(s) / ED Diagnoses   Final diagnoses:  Post concussion syndrome    New Prescriptions New Prescriptions   HYDROCODONE-ACETAMINOPHEN (NORCO/VICODIN) 5-325 MG TABLET    Take 1-2 tablets by mouth every 6 (six) hours as needed for moderate pain.    I personally performed the services described in this documentation, which was scribed in my presence. The recorded information has been reviewed and is accurate.       Chelsey Mulders, MD 09/29/16 1651

## 2016-09-29 NOTE — ED Triage Notes (Signed)
Pt states she hit her head in her chicken coop last Thursday. She was dx with a concussion by her PCP and has continued to have confusion, forgetfullness, as well as headaches. She was told she would need to see a neurologist and sent here for head CT. Pt alert and oriented.

## 2016-09-29 NOTE — Discharge Instructions (Signed)
Head CT negative. CT neck negative. Follow-up with neurology. Work note provided. Symptoms consistent with a postconcussive syndrome. Rest as much as possible. Take the hydrocodone as needed for pain. School note provided.

## 2016-09-29 NOTE — ED Notes (Signed)
Patient transported to CT 

## 2016-09-30 ENCOUNTER — Telehealth: Payer: Self-pay

## 2016-09-30 NOTE — Telephone Encounter (Signed)
Patient was referred to us from Neurology. She suffered a head injury on 09-22-2016. She was reaching into a chicken coup and she stood up hitting the posterior aspect of her head on a window frame. She went to bed and woke up with a headache. She proceed to go to work and noticed that she forgot about a meeting she was suppose to have until the front desk paged her. She has also noticed that she is sensitive to motion, light, and noise. Patient has tried working a few days since her injury. Patient did have head CT which was negative. She does have a history of migraines. She uses a medication called gaba for her migraines. No prior concussion history. Recommended that patient return to ER over weekend if she experiences sudden increase in any of her symptoms and to limit her screen time and physical activity until seen by us on Tuesday.

## 2016-10-03 NOTE — Progress Notes (Signed)
Subjective:   @VITALSMCOMMENTS @  Chief Complaint: Chelsey Thompson, DOB: 1960/09/28, is a 56 y.o. female who presents for head injury. She suffered the njury on 09-22-2016. She was reaching into a chicken coup and she stood up hitting the posterior aspect of her head on a window frame. She went to bed and woke up with a headache. She proceed to go to work and noticed that she forgot about a meeting she was suppose to have until the front desk paged her. She has also noticed that she is sensitive to motion, light, and noise. Patient has tried working a few days since her injury. Patient did have head CT which was negative. The ED said that she should stay ouf of work until the 11th of May so she has not been back to work. She does have a history of migraines. She uses a medication called gaba for her migraines. No prior concussion history.   Injury date : 09/22/2016 Visit #: 1  History of Present Illness:    Concussion Self-Reported Symptom Score Symptoms rated on a scale 1-6, in last 24 hours  Headache: 2  Nausea: 0  Vomiting: 0  Balance Difficulty: 3   Dizziness: 1  Fatigue: 1  Trouble Falling Asleep: 0  Sleep More Than Usual: 0  Sleep Less Than Usual: 0  Daytime Drowsiness: 2  Photophobia: 4  Phonophobia: 5  Irritability: 2  Sadness: 2  Nervousness: 0  Feeling More Emotional: 2  Numbness or Tingling: 0  Feeling Slowed Down: 3  Feeling Mentally Foggy: 3  Difficulty Concentrating: 5  Difficulty Remembering: 3  Visual Problems: 0    Total Symptom Score: 37   Review of Systems: Pertinent items are noted in HPI.  Review of History: Past Medical History: @PMHP @  Past Surgical History:  has a past surgical history that includes Breast surgery (Left). Family History: family history includes COPD in her mother; Cancer in her father. Social History:  reports that she quit smoking about 29 years ago. She has never used smokeless tobacco. She reports that she drinks alcohol. She  reports that she does not use drugs. Current Medications: has a current medication list which includes the following prescription(s): amlodipine-benazepril, diclofenac, and hydrocodone-acetaminophen. Allergies: is allergic to sulfa antibiotics.  Objective:    Physical Examination Vitals:   10/04/16 0825  BP: 128/90  Pulse: 62   General appearance: alert, appears stated age and cooperative Head: Normocephalic, without obvious abnormality, atraumatic Eyes: conjunctivae/corneas clear. PERRL, EOM's intact. Fundi benign. Sclera anicteric. Lungs: clear to auscultation bilaterally and percussion Heart: regular rate and rhythm, S1, S2 normal, no murmur, click, rub or gallop Neurologic: CN 2-12 normal.  Sensation to pain, touch, and proprioception normal.  DTRs  normal in upper and lower extremities. No pathologic reflexes. Neg rhomberg, modified rhomberg, pronator drift, tandem gait, finger-to-nose; see post-concussion vestibular and oculomotor testing in chart Psychiatric: Oriented X3, intact recent and remote memory, judgement and insight, normal mood and affect  Concussion testing performed today:  Neurocognitive testing (ImPACT):   Post #1: 1   Verbal Memory Composite  91 (81%)   Visual Memory Composite  57 (35%)   Visual Motor Speed Composite  29.90 (19%)   Reaction Time Composite  .96 (5%)   Cognitive Efficiency Index  .19    Vestibular Screening:   Pre VOMS  HA Score:y Pre VOMS  Dizziness Score:y   Headache  Dizziness  Smooth Pursuits n y  H. Saccades n y  V. Saccades y n  H.  VOR n y  V. VOR n y  Biomedical scientistVisual Motor Sensitivity n y  Accommodation Right: 22 cm Left: 19 cm n n  Convergence: 10 cm n n     Additional testing performed today:    Assessment:    No diagnosis found.  Chelsey Thompson presents with the following concussion subtypes. [] Cognitive [] Cervical [] Vestibular [x] Ocular [] Migraine [x] Anxiety/Mood   Plan:   Action/Discussion: Reviewed diagnosis,  management options, expected outcomes, and the reasons for scheduled and emergent follow-up. Questions were adequately answered. Patient expressed verbal understanding and agreement with the following plan.      Participation in school/work:   Patient may return to work on 5/16, will be seen the day before.   Active Treatment Strategies:  Fueling your brain is important for recovery. It is essential to stay well hydrated, aiming for half of your body weight in fluid ounces per day (100 lbs = 50 oz). We also recommend eating breakfast to start your day and focus on a well-balanced diet containing lean protein, 'good' fats, and complex carbohydrates. See your nutrition / hydration handout for more details.   Quality sleep is vital in your concussion recovery. We encourage lots of sleep for the first 24-72 hours after injury but following this period it is important to regulate your sleep cycle. We encourage 8 hours of quality sleep per night. See your sleep handout for more details and strategies to quality sleep.  IF NOT USING THE OPTIONS BELOW DELETE THEM  Treating your vestibular and visual dysfunction will decrease your recovery time and improve your symptoms. Begin your home vestibular exercise program as directed on your AVS.    Begin taking Amantadine medicine as directed.   Begin taking DHA supplement as directed.    Begin home exercise program for neck as directed.   Follow-up information:  Follow up appointment at [facility] in 1 week .   Call [facility] at [phone] at least 24 hours after completion of Stage 4 with status update.  Patient needs to arrive 30 minutes prior to appointment to complete the following tests: Impact .    Patient Education:  Reviewed with patient the risks (i.e, a repeat concussion, post-concussion syndrome, second-impact syndrome) of returning to play prior to complete resolution, and thoroughly reviewed the signs and  symptoms of concussion.Reviewed need for complete resolution of all symptoms, with rest AND exertion, prior to return to play.  Reviewed red flags for urgent medical evaluation: worsening symptoms, nausea/vomiting, intractable headache, musculoskeletal changes, focal neurological deficits.  [facility] Concussion Care Plan, which clearly outlines the plans stated above, was given to patient.  I was personally involved with the physical evaluation of and am in agreement with the assessment and treatment plan for this patient.  Greater than 50% of this encounter was spent in direct consultation with the patient in evaluation, counseling, and coordination of care. Duration of encounter: 60 minutes.  After Visit Summary printed out and provided to patient as appropriate.  This note is written by Judi SaaZachary M Eldrige Pitkin, in the presence of and acting as the scribe of Judi SaaZachary M Remingtyn Depaola, DO.

## 2016-10-04 ENCOUNTER — Ambulatory Visit (INDEPENDENT_AMBULATORY_CARE_PROVIDER_SITE_OTHER): Payer: BLUE CROSS/BLUE SHIELD | Admitting: Family Medicine

## 2016-10-04 ENCOUNTER — Encounter: Payer: Self-pay | Admitting: Family Medicine

## 2016-10-04 DIAGNOSIS — S060XAA Concussion with loss of consciousness status unknown, initial encounter: Secondary | ICD-10-CM | POA: Insufficient documentation

## 2016-10-04 DIAGNOSIS — S060X9A Concussion with loss of consciousness of unspecified duration, initial encounter: Secondary | ICD-10-CM | POA: Insufficient documentation

## 2016-10-04 DIAGNOSIS — S060X0A Concussion without loss of consciousness, initial encounter: Secondary | ICD-10-CM | POA: Diagnosis not present

## 2016-10-04 NOTE — Assessment & Plan Note (Signed)
Patient does have a concussion. Discussed avoiding significant pain medication. We discussed over-the-counter medications. Patient will be held out of work at this moment until patient is reexamined again in 1 week. At that time as long as patient is doing well we will advance her accordingly. Follow-up again in 1 week

## 2016-10-04 NOTE — Patient Instructions (Signed)
Good to see you I do think you have a concussion.  I would like you to limit screen time (including your phone) to 30 minutes daily outside of work.  In addition to this I recommend......  To help improve COGNITIVE function: Using fish oil/omega 3 that is 1000 mg (or roughly 600 mg EPA/DHA), starting as soon as possible after concussion, take: 3 tabs TWICE DAILY  for the next 3 days, then 3 tabs ONCE DAILY  for the next 10 days   To help reduce HEADACHES: Coenzyme Q10 160mg  ONCE DAILY Riboflavin/Vitamin B2 400mg  ONCE DAILY Magnesium oxide 400mg  ONCE - TWICE DAILY May stop after headaches are resolved.           I want to see you again next week (OK to double book)

## 2016-10-07 ENCOUNTER — Telehealth: Payer: Self-pay | Admitting: Family

## 2016-10-07 NOTE — Telephone Encounter (Signed)
Spoke with patient. She wanted to know if she was ok to drive short distances around town. I said that she would be fine to drive but advise against long distances. She also wanted to know if vacuuming was ok as she is sensitive to noise. I said to be sure to not exacerbate her symptoms that she should wear ear plugs but is ok to vacuum. She also wanted to know if she could take gaba an over the counter medication. Per a verbal, Dr. Katrinka BlazingSmith she is ok to take this medication. Patient voices understanding.

## 2016-10-07 NOTE — Telephone Encounter (Signed)
Pt has questions about her ability to do activities and such. Please call back.

## 2016-10-10 NOTE — Progress Notes (Signed)
Subjective:   @VITALSMCOMMENTS @  Chief Complaint: Chelsey Thompson, DOB: 03/02/1961, is a 56 y.o. female who presents for follow-up for head injury. She has been doing some work at home and states that her concentration has improved. She does not feel fatigued by the end of the day with work and ADLs. Saturday was the last day she had a headache. She has not been sleeping due to other stressors in life ie. Son and death in the family and does not feeling slowed down still.  Chief Complaint  Patient presents with  . Head Injury    concussion    Injury date : 09-22-2016 Visit #: 2  History of Present Illness:   Concussion Self-Reported Symptom Score Symptoms rated on a scale 1-6, in last 24 hours  Headache: 1  Nausea: 0  Vomiting: 0  Balance Difficulty: 0  Dizziness: 0  Fatigue: 0  Trouble Falling Asleep: 2  Sleep More Than Usual: 0  Sleep Less Than Usual: 2  Daytime Drowsiness: 1  Photophobia: 4  Phonophobia: 3  Irritability: 3  Sadness: 1  Nervousness: 0  Feeling More Emotional: 2  Numbness or Tingling: 0  Feeling Slowed Down: 2  Feeling Mentally Foggy: 2  Difficulty Concentrating: 2  Difficulty Remembering: 2  Visual Problems: 1    Total Symptom Score: 28 Previous symptom score: 37  Review of Systems: Pertinent items are noted in HPI.  Review of History: Past Medical History: @PMHP @  Past Surgical History:  has a past surgical history that includes Breast surgery (Left). Family History: family history includes COPD in her mother; Cancer in her father. Social History:  reports that she quit smoking about 29 years ago. She has never used smokeless tobacco. She reports that she drinks alcohol. She reports that she does not use drugs. Current Medications: has a current medication list which includes the following prescription(s): amlodipine-benazepril, diclofenac, and hydrocodone-acetaminophen. Allergies: is allergic to sulfa antibiotics.  Objective:    Physical  Examination Vitals:   10/11/16 0909  BP: 128/82  Pulse: 80   General appearance: alert, appears stated age and cooperative Head: Normocephalic, without obvious abnormality, atraumatic Eyes: conjunctivae/corneas clear. PERRL, EOM's intact. Fundi benign. Sclera anicteric. Lungs: clear to auscultation bilaterally and percussion Heart: regular rate and rhythm, S1, S2 normal, no murmur, click, rub or gallop Neurologic: CN 2-12 normal.  Sensation to pain, touch, and proprioception normal.  DTRs  normal in upper and lower extremities. No pathologic reflexes. Neg rhomberg, modified rhomberg, pronator drift, tandem gait, finger-to-nose; see post-concussion vestibular and oculomotor testing in chart Psychiatric: Oriented X3, intact recent and remote memory, judgement and insight, normal mood and affect  Concussion testing performed today:  Neurocognitive testing (ImPACT):   Post #2:    Verbal Memory Composite  94 (89%)   Visual Memory Composite  77 (87%)   Visual Motor Speed Composite  31.25 (41%)   Reaction Time Composite  .84 (9%)   Cognitive Efficiency Index  .28      Assessment:    Concussion without loss of consciousness, subsequent encounter  Carri Kadlec presents with the following concussion subtypes. [] Cognitive [] Cervical [] Vestibular [] Ocular [] Migraine [] Anxiety/Mood   Plan:   Action/Discussion: Reviewed diagnosis, management options, expected outcomes, and the reasons for scheduled and emergent follow-up. Questions were adequately answered. Patient expressed verbal understanding and agreement with the following plan.      Participation in school/work: Patient is cleared to return to work/school and activities of daily living without restrictions on Thursday 10/13/16 After Visit Summary  printed out and provided to patient as appropriate.  This note is written by Judi Saa, in the presence of and acting as the scribe of Judi Saa, DO.

## 2016-10-11 ENCOUNTER — Encounter: Payer: Self-pay | Admitting: Family Medicine

## 2016-10-11 ENCOUNTER — Ambulatory Visit (INDEPENDENT_AMBULATORY_CARE_PROVIDER_SITE_OTHER): Payer: BLUE CROSS/BLUE SHIELD | Admitting: Family Medicine

## 2016-10-11 DIAGNOSIS — S060X0D Concussion without loss of consciousness, subsequent encounter: Secondary | ICD-10-CM

## 2016-10-11 NOTE — Assessment & Plan Note (Signed)
Improved, Impact scores improved. Symptoms. Doing well. Will increase activity slowly return to work on Thursday  Should do well.  RTC in 2 weeks if not symptom free.  Spent  35 minutes with patient face-to-face and had greater than 50% of counseling including as described above in assessment and plan.

## 2016-10-11 NOTE — Patient Instructions (Signed)
Go to see you  I am so glad you are doing better Would consider the fish oil 1 gram daily for a month then up to you if you want to continue Vitamin D 2000 IU daily could help with endurance.  See me only if you have trouble.

## 2016-10-14 NOTE — Telephone Encounter (Signed)
I have received FMLA paperwork via fax for patient. Fills out what I could on forms. Forms have been placed in Mds box to be completed. Please return to [place] when complete. Thank you.

## 2016-10-17 DIAGNOSIS — Z0279 Encounter for issue of other medical certificate: Secondary | ICD-10-CM

## 2016-10-17 NOTE — Telephone Encounter (Signed)
Forms have been completed. Faxed 5/21, Sent to scan, Copy upfront for pick up. &charged for.

## 2016-10-18 ENCOUNTER — Telehealth: Payer: Self-pay | Admitting: Family Medicine

## 2016-10-18 NOTE — Telephone Encounter (Signed)
Patient states she was written out of work.  Went back to work on May 17th.  Patient states while at work she was really sleepy before lunch and at lunch started seeing spots in front of her left eye and having a line movement through her eye.  States this went on for 20 minutes and stopped then the back of her head started hurting.  Patient states she had someone pick her up from work and is now at home.  Patient is requesting a letter to be faxed to her employer to excuse her from work today.  Fax: [phone] [facility]([employer] Attn:  Elizabeth PalauSandra Connor).

## 2016-10-18 NOTE — Telephone Encounter (Signed)
Patient will call back tomorrow to see how she is feeling and if isn't better will come in at 12:45pm time slot.

## 2016-10-18 NOTE — Telephone Encounter (Signed)
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

## 2016-10-18 NOTE — Telephone Encounter (Signed)
Spoke with patient. She went back to work on 10/13/16 and has had a hard time concentrating. She was at work today 10/18/2016 and went home around 10:30 as she was feeling sleepy, seeing a line in the left eye, and developing a headache. Saturday and Sunday she was unable to concentrate into late in the day. I recommended that patient be seen again for her symptoms. She declined an appointment on Thursday. Offered her an appointment on Wednesday in which she was unsure she wanted to take. She is going to call back to let me know if she would like 12:45pm on Wednesday 10/19/2016.

## 2016-10-19 NOTE — Telephone Encounter (Signed)
Front desk called to let me know that patient called to let us know that she is feeling better and does not need to come in for an appointment today.

## 2016-11-22 ENCOUNTER — Ambulatory Visit: Payer: BLUE CROSS/BLUE SHIELD | Admitting: Family Medicine

## 2016-12-01 ENCOUNTER — Encounter: Payer: Self-pay | Admitting: Family Medicine

## 2016-12-01 ENCOUNTER — Ambulatory Visit (INDEPENDENT_AMBULATORY_CARE_PROVIDER_SITE_OTHER): Payer: BLUE CROSS/BLUE SHIELD | Admitting: Family Medicine

## 2016-12-01 DIAGNOSIS — S161XXA Strain of muscle, fascia and tendon at neck level, initial encounter: Secondary | ICD-10-CM | POA: Diagnosis not present

## 2016-12-01 DIAGNOSIS — S060X0D Concussion without loss of consciousness, subsequent encounter: Secondary | ICD-10-CM | POA: Diagnosis not present

## 2016-12-01 NOTE — Progress Notes (Signed)
e

## 2016-12-01 NOTE — Patient Instructions (Signed)
Good to see you  pennsaid pinkie amount topically 2 times daily as needed.   COntinue the vitamins Try 2 tennis balls in a tube sock and lay on them where head meets neck and wait for a release 1-2 times a day  You will do great  Wear really good sunglasses on your trip  Sewe me again in 4 weeks if not all the way gone.

## 2016-12-01 NOTE — Progress Notes (Signed)
[facility] [address] [place], [place] [address] Phone: [phone] Subjective:    I'm seeing this patient by the request  of:    CC: Concussion follow-up neck pain   ION:[id number]  Chelsey Thompson is a 56 y.o. female coming in with complaint of concussion. Patient was seen previously. Was diagnosed with a concussion. Subsequent encounter showed the patient was resolving very quickly. Patient was to follow back but was doing much better. Patient states Still having neck pain. Describes the pain as a dull, throbbing aching pain in the basilar skull. 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 Patient states that it seems and light seems to be the biggest trigger. Patient denies any radiation down the arms or any numbness. Denies any weakness of the extremities.     Past Medical History:  Diagnosis Date  . Frequent headaches   . Hypertension    Past Surgical History:  Procedure Laterality Date  . BREAST SURGERY Left    Cancer and implant   Social History   Social History  . Marital status: Married    Spouse name: N/A  . Number of children: N/A  . Years of education: N/A   Social History Main Topics  . Smoking status: Former Smoker    Quit date: 05/31/1987  . Smokeless tobacco: Never Used  . Alcohol use Yes     Comment: Drank heavily for 14 years.  . Drug use: No  . Sexual activity: Not Asked   Other Topics Concern  . None   Social History Narrative  . None   Allergies  Allergen Reactions  . Sulfa Antibiotics     Rash?   Family History  Problem Relation Age of Onset  . COPD Mother   . Cancer Father     Past medical history, social, surgical and family history all reviewed in electronic medical record.  No pertanent information unless stated regarding to the chief complaint.   Review of Systems:Review of systems updated and as accurate as of 12/01/16  No headache, visual changes,  nausea, vomiting, diarrhea, constipation, dizziness, abdominal pain, skin rash, fevers, chills, night sweats, weight loss, swollen lymph nodes, body aches, joint swelling, muscle aches, chest pain, shortness of breath, mood changes.   Objective  Blood pressure 120/82, pulse 70, height 5\' 6"  (1.676 m), weight 164 lb (74.4 kg). Systems examined below as of 12/01/16   General: No apparent distress alert and oriented x3 mood and affect normal, dressed appropriately.  HEENT: Pupils equal, extraocular movements intact  Respiratory: Patient's speak in full sentences and does not appear short of breath  Cardiovascular: No lower extremity edema, non tender, no erythema  Skin: Warm dry intact with no signs of infection or rash on extremities or on axial skeleton.  Abdomen: Soft nontender  Neuro: Cranial nerves II through XII are intact, neurovascularly intact in all extremities with 2+ DTRs and 2+ pulses.  Lymph: No lymphadenopathy of posterior or anterior cervical chain or axillae bilaterally.  Gait normal with good balance and coordination.  MSK:  Non tender with full range of motion and good stability and symmetric strength and tone of shoulders, elbows, wrist, hip, knee and ankles bilaterally.  Neck: Inspection unremarkable. No palpable stepoffs. Mild pain in the occipital region on the left sign. Mild muscle spasm palpated. Negative Spurling's maneuver. Full neck range of motion Grip strength and sensation normal in bilateral hands Strength good C4 to T1 distribution No  sensory change to C4 to T1 Negative Hoffman sign bilaterally Reflexes normal   Impression and Recommendations:     This case required medical decision making of moderate complexity.      Note: This dictation was prepared with Dragon dictation along with smaller phrase technology. Any transcriptional errors that result from this process are unintentional.

## 2016-12-01 NOTE — Assessment & Plan Note (Signed)
New problem. We discussed over-the-counter medications, discussed icing, trial of oral anti-inflammatories. I believe the patient is doing relatively well. Able to go back to work. We discussed self manipulation techniques that can be beneficial. Follow-up again in 6-8 weeks if not completely resolved. Minimal considering imaging.

## 2016-12-01 NOTE — Assessment & Plan Note (Signed)
Completely resolved at this time. 

## 2017-08-21 ENCOUNTER — Other Ambulatory Visit: Payer: Self-pay | Admitting: Obstetrics and Gynecology

## 2017-08-21 DIAGNOSIS — Z1231 Encounter for screening mammogram for malignant neoplasm of breast: Secondary | ICD-10-CM

## 2017-11-08 ENCOUNTER — Ambulatory Visit
Admission: RE | Admit: 2017-11-08 | Discharge: 2017-11-08 | Disposition: A | Discharge status: Home / Self Care | Payer: BLUE CROSS/BLUE SHIELD | Source: Ambulatory Visit | Attending: Obstetrics and Gynecology | Admitting: Obstetrics and Gynecology

## 2017-11-08 DIAGNOSIS — Z1231 Encounter for screening mammogram for malignant neoplasm of breast: Secondary | ICD-10-CM

## 2018-11-08 ENCOUNTER — Other Ambulatory Visit: Payer: Self-pay | Admitting: Obstetrics and Gynecology

## 2018-11-08 DIAGNOSIS — Z1231 Encounter for screening mammogram for malignant neoplasm of breast: Secondary | ICD-10-CM

## 2018-12-26 ENCOUNTER — Ambulatory Visit
Admission: RE | Admit: 2018-12-26 | Discharge: 2018-12-26 | Disposition: A | Discharge status: Home / Self Care | Payer: BLUE CROSS/BLUE SHIELD | Source: Ambulatory Visit | Attending: Obstetrics and Gynecology | Admitting: Obstetrics and Gynecology

## 2018-12-26 ENCOUNTER — Other Ambulatory Visit: Payer: Self-pay

## 2018-12-26 DIAGNOSIS — Z1231 Encounter for screening mammogram for malignant neoplasm of breast: Secondary | ICD-10-CM

## 2018-12-27 ENCOUNTER — Other Ambulatory Visit: Payer: Self-pay | Admitting: Obstetrics and Gynecology

## 2018-12-27 DIAGNOSIS — R234 Changes in skin texture: Secondary | ICD-10-CM

## 2018-12-31 ENCOUNTER — Ambulatory Visit
Admission: RE | Admit: 2018-12-31 | Discharge: 2018-12-31 | Disposition: A | Discharge status: Home / Self Care | Payer: BC Managed Care – PPO | Source: Ambulatory Visit | Attending: Obstetrics and Gynecology | Admitting: Obstetrics and Gynecology

## 2018-12-31 ENCOUNTER — Other Ambulatory Visit: Payer: Self-pay

## 2018-12-31 DIAGNOSIS — R234 Changes in skin texture: Secondary | ICD-10-CM

## 2019-08-22 ENCOUNTER — Other Ambulatory Visit: Payer: Self-pay

## 2019-08-22 DIAGNOSIS — G44009 Cluster headache syndrome, unspecified, not intractable: Secondary | ICD-10-CM

## 2019-08-26 ENCOUNTER — Other Ambulatory Visit: Payer: Self-pay

## 2019-08-26 DIAGNOSIS — R519 Headache, unspecified: Secondary | ICD-10-CM

## 2019-08-27 ENCOUNTER — Other Ambulatory Visit: Payer: Self-pay

## 2019-08-27 DIAGNOSIS — R519 Headache, unspecified: Secondary | ICD-10-CM

## 2019-08-28 ENCOUNTER — Other Ambulatory Visit: Payer: Self-pay

## 2019-08-28 DIAGNOSIS — G44009 Cluster headache syndrome, unspecified, not intractable: Secondary | ICD-10-CM

## 2019-09-05 ENCOUNTER — Other Ambulatory Visit: Payer: Self-pay

## 2019-09-05 ENCOUNTER — Ambulatory Visit
Admission: RE | Admit: 2019-09-05 | Discharge: 2019-09-05 | Disposition: A | Discharge status: Home / Self Care | Payer: BC Managed Care – PPO | Source: Ambulatory Visit | Attending: Family Medicine | Admitting: Family Medicine

## 2019-09-05 DIAGNOSIS — G44009 Cluster headache syndrome, unspecified, not intractable: Secondary | ICD-10-CM

## 2019-09-16 ENCOUNTER — Other Ambulatory Visit: Payer: BC Managed Care – PPO

## 2019-09-20 ENCOUNTER — Other Ambulatory Visit: Payer: BC Managed Care – PPO

## 2019-11-07 NOTE — Progress Notes (Signed)
 CC: Gyn Problem  HPI: Chelsey Thompson is a 58 y.o. y/o [id number] who presents today with complaints of bloating and concerns for ovarian cancer. She has a known history of IBS, but does not feel this is related. She has been using a probiotic and feels the gut irritation has been better.    Current Outpatient Medications:  .  amLODIPine (NORVASC) 2.5 MG tablet, amlodipine 2.5 mg tablet  TAKE 1 TABLET BY MOUTH EVERY DAY, Disp: , Rfl:  .  benzonatate (TESSALON) 100 MG capsule, benzonatate 100 mg capsule  TK 1 C PO TID PRN UNTIL DIRECTED TO STOP, Disp: , Rfl:  .  linaclotide (LINZESS) 72 mcg capsule, Take 1 capsule (72 mcg total) by mouth daily., Disp: 30 capsule, Rfl: 6  Allergies  Allergen Reactions  . Sulfa (Sulfonamide Antibiotics)     Rash?    Past Medical History:  Diagnosis Date  . Alcoholism (CMS-HCC)   . Asthma   . Breast cancer (CMS-HCC)   . Depression   . Hypertension   . Migraine     Past Surgical History:  Procedure Laterality Date  . BREAST RECONSTRUCTION Left 03/2007  . breast reconstruction Left 10/2007  . COMBINED REDUCTION MAMMAPLASTY W/ LIPOSUCTION Right 10/2007  . MASTECTOMY Left 11/2006    Family History  Problem Relation Age of Onset  . Lung cancer Mother   . Ovarian cancer Maternal Aunt     OB History  Gravida Para Term Preterm AB Living  7 2 2   5 2   SAB TAB Ectopic Molar Multiple Live Births  5         2    # Outcome Date GA Lbr Len/2nd Weight Sex Delivery Anes PTL Lv  7 Term 2003    M Vag-Spont   LIV  6 Term 1998    M Vag-Spont   LIV  5 SAB           4 SAB           3 SAB           2 SAB           1 SAB             GYNECOLOGIC HISTORY: -Hx of STI: No -Hx of abnormal Pap: No -Last Pap: 10/2017: Normal -Sexually active: Yes -Contraception: Menopause No LMP recorded. Patient is postmenopausal.  REVIEW OF SYSTEMS:  All other systems reviewed were negative except for those pertinent positives documented in HPI   ______________________________________________________________________  PHYSICAL EXAM: BP 120/80   Wt 81 kg (178 lb 8 oz)   BMI 28.81 kg/m    -Constitutional: NAD.  The patient is awake and alert. -GU:     Normal external female genitalia, normal urethral meatus with normal caliber urethra in the midline. Normal Bartholin's and Skene's glands, bladder well-suspended, Vagina well rugated and no lesions, Cervix normal without discharge, polyp, motion tenderness, Uterus anteverted and mobile, No masses or tenderness. Anus without any fissures or hemorhoids  ____________________________________________________________________ Impression:  Chelsey Thompson is a 59 y.o. y/o [id number] who presents today for gyn problem  Diagnoses and all orders for this visit:  Abdominal bloating -     US  Pelvis Transabdominal Complete (23143); Future -     US  Endovaginal (Non-OB) (23169); Future  Screening for cervical cancer -     Pap Smear; Future  Pelvic pain in female -     US  Pelvis Transabdominal Complete (23143); Future -  US  Endovaginal (Non-OB) [phone]); Future    1) Bloating and pelvic pain: patient presents with concerns for bloating and pelvic pain. She has a known history of IBS, but is concerned this is different. She has a family history of ovarian cancer in a maternal aunt and is worried. Will check a pelvic US  to evaluate ovaries. Will also perform pap since near 2 years since last screening. Will follow up results.  2) RTC in Return in about 2 weeks (around 11/21/2019) for pelvic US .

## 2019-11-29 ENCOUNTER — Other Ambulatory Visit: Payer: Self-pay | Admitting: Obstetrics and Gynecology

## 2019-11-29 DIAGNOSIS — Z1231 Encounter for screening mammogram for malignant neoplasm of breast: Secondary | ICD-10-CM

## 2020-01-01 ENCOUNTER — Other Ambulatory Visit: Payer: Self-pay

## 2020-01-01 ENCOUNTER — Ambulatory Visit
Admission: RE | Admit: 2020-01-01 | Discharge: 2020-01-01 | Disposition: A | Discharge status: Home / Self Care | Payer: BC Managed Care – PPO | Source: Ambulatory Visit | Attending: Obstetrics and Gynecology | Admitting: Obstetrics and Gynecology

## 2020-01-01 DIAGNOSIS — Z1231 Encounter for screening mammogram for malignant neoplasm of breast: Secondary | ICD-10-CM

## 2020-01-16 ENCOUNTER — Encounter: Payer: Self-pay | Admitting: Genetic Counselor

## 2020-09-30 IMAGING — MG DIGITAL SCREENING UNILAT RIGHT W/ CAD
3 series · 3 of 3 positions shown · non-contrast
Comparison: Previous exam(s).

CLINICAL DATA: Screening.

EXAM:
DIGITAL SCREENING UNILATERAL RIGHT MAMMOGRAM WITH CAD

[R MLO (1 of 2)]
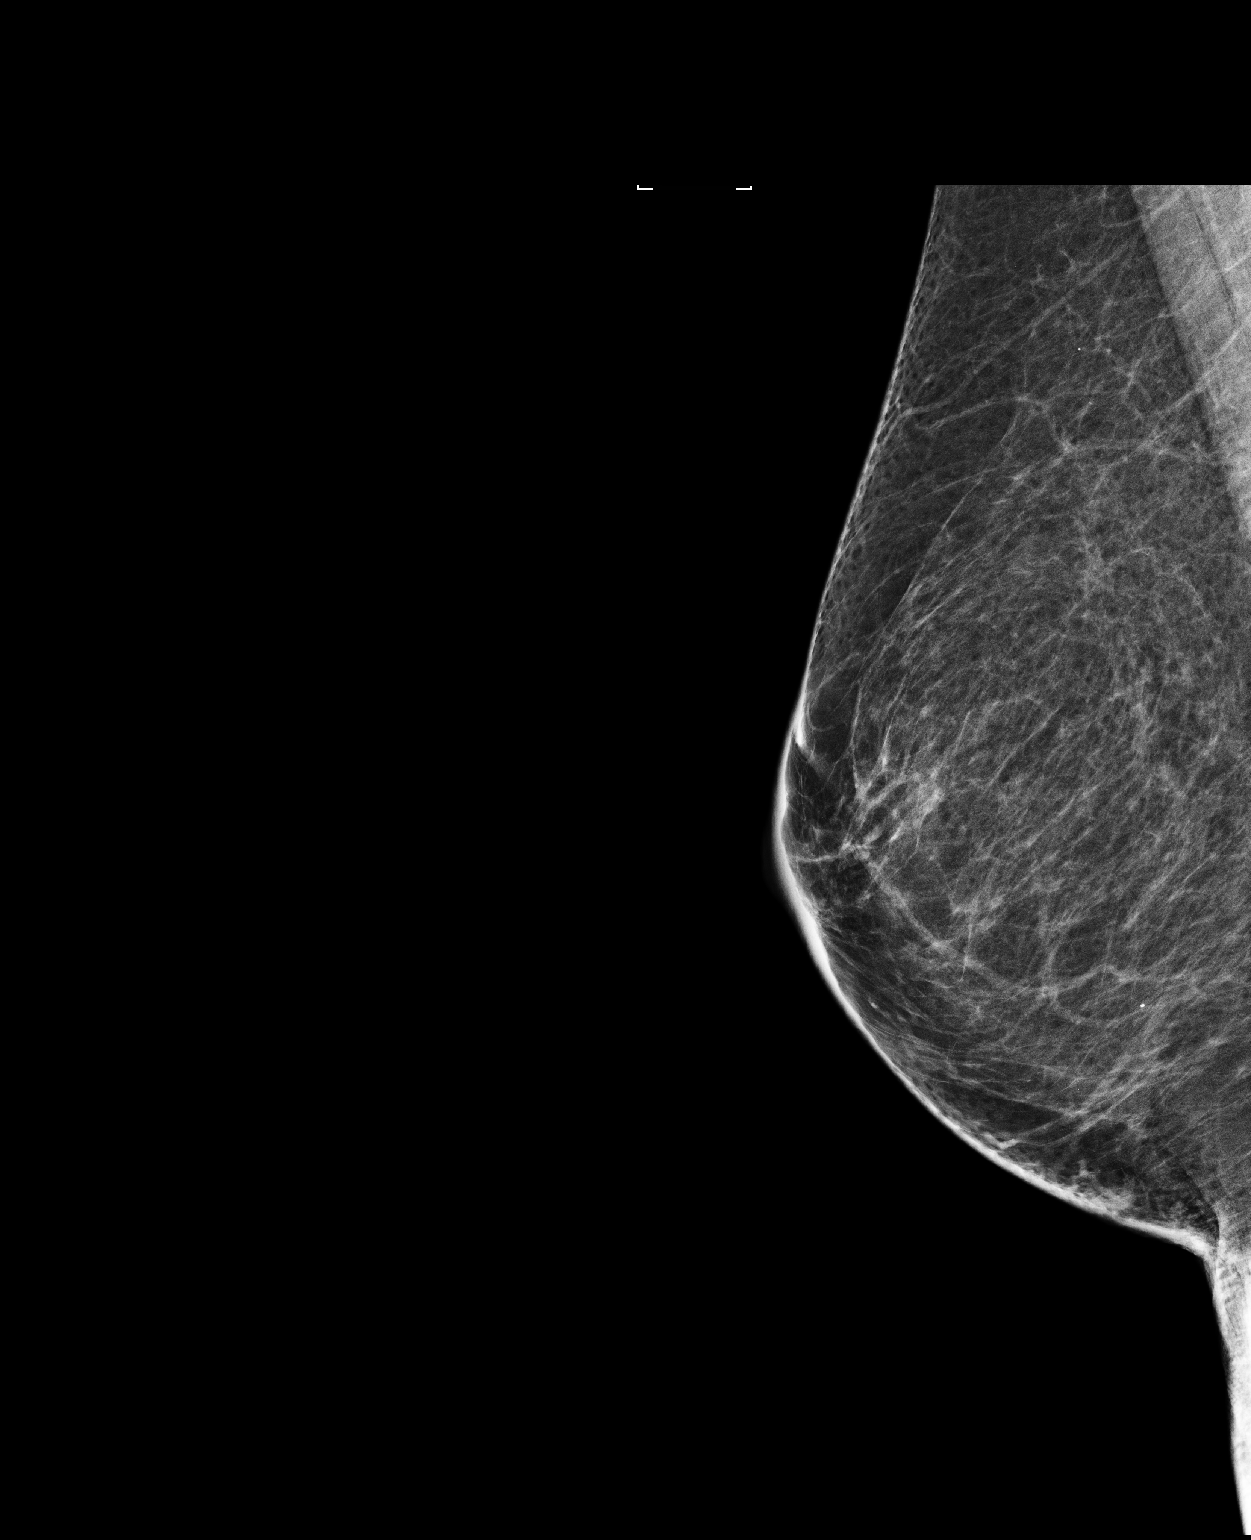

[R CC]
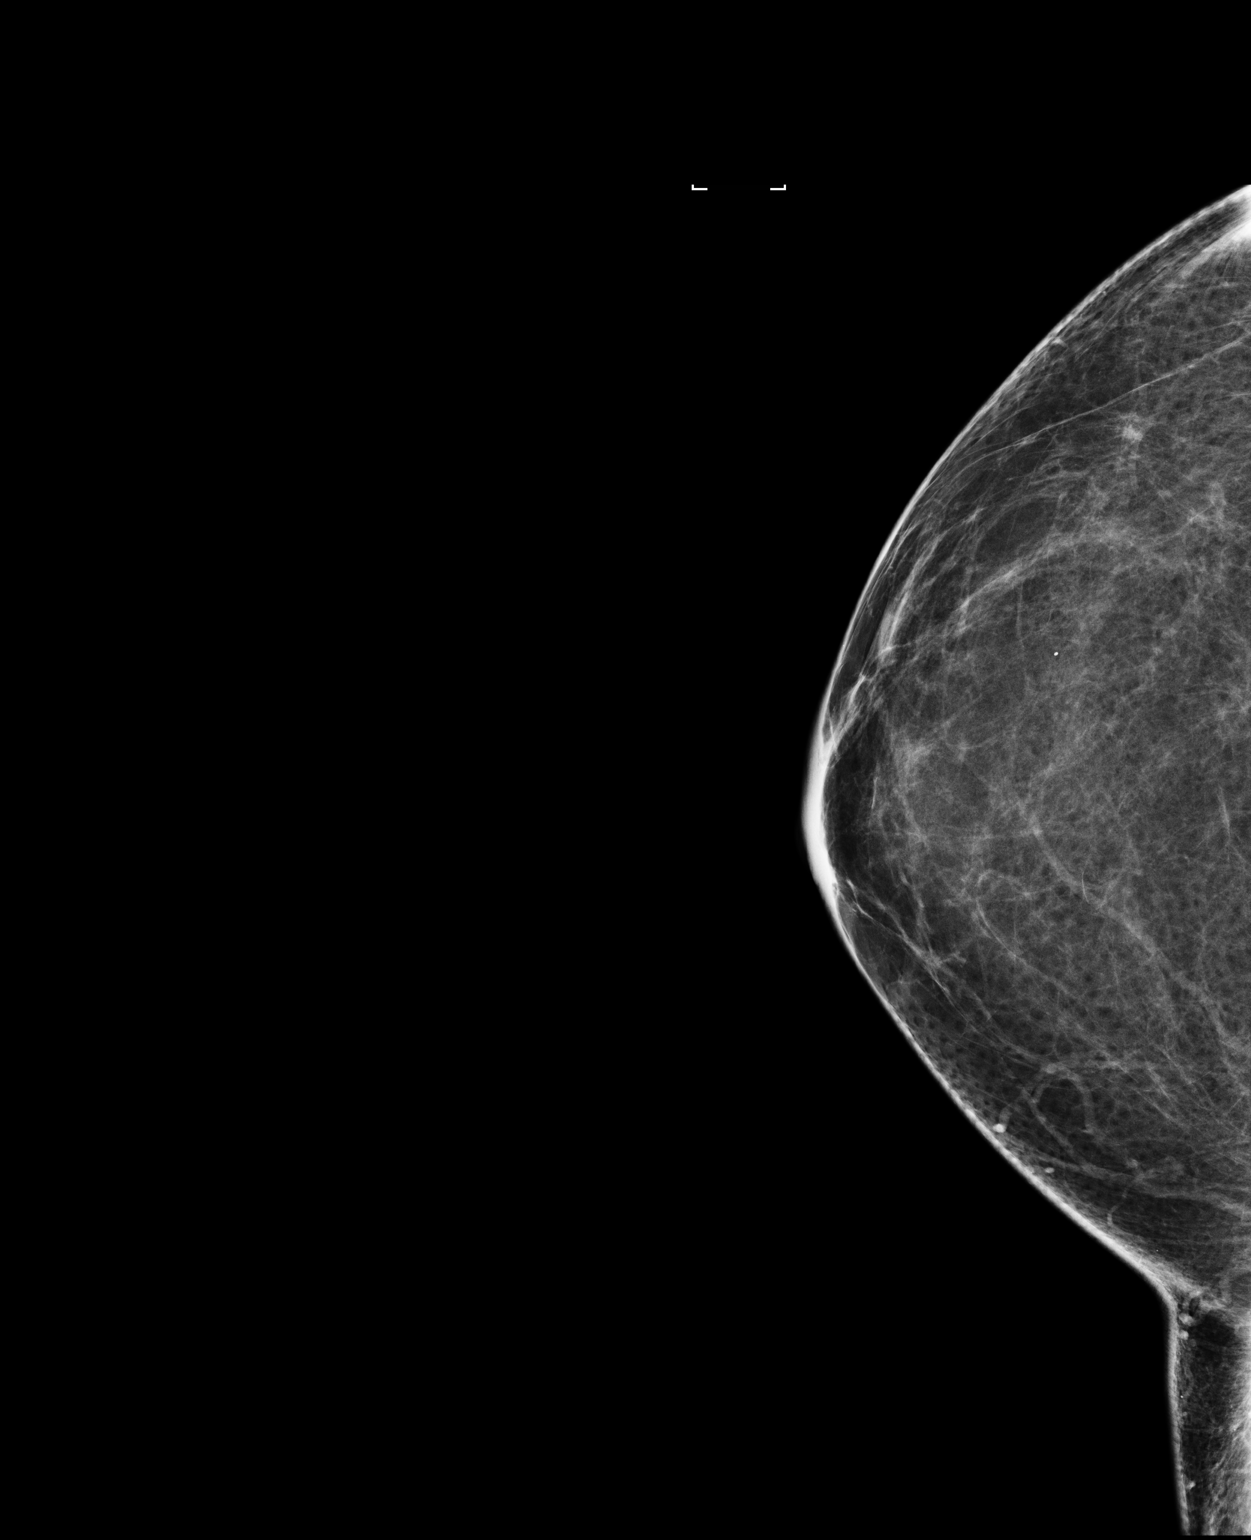

[R MLO (2 of 2)]
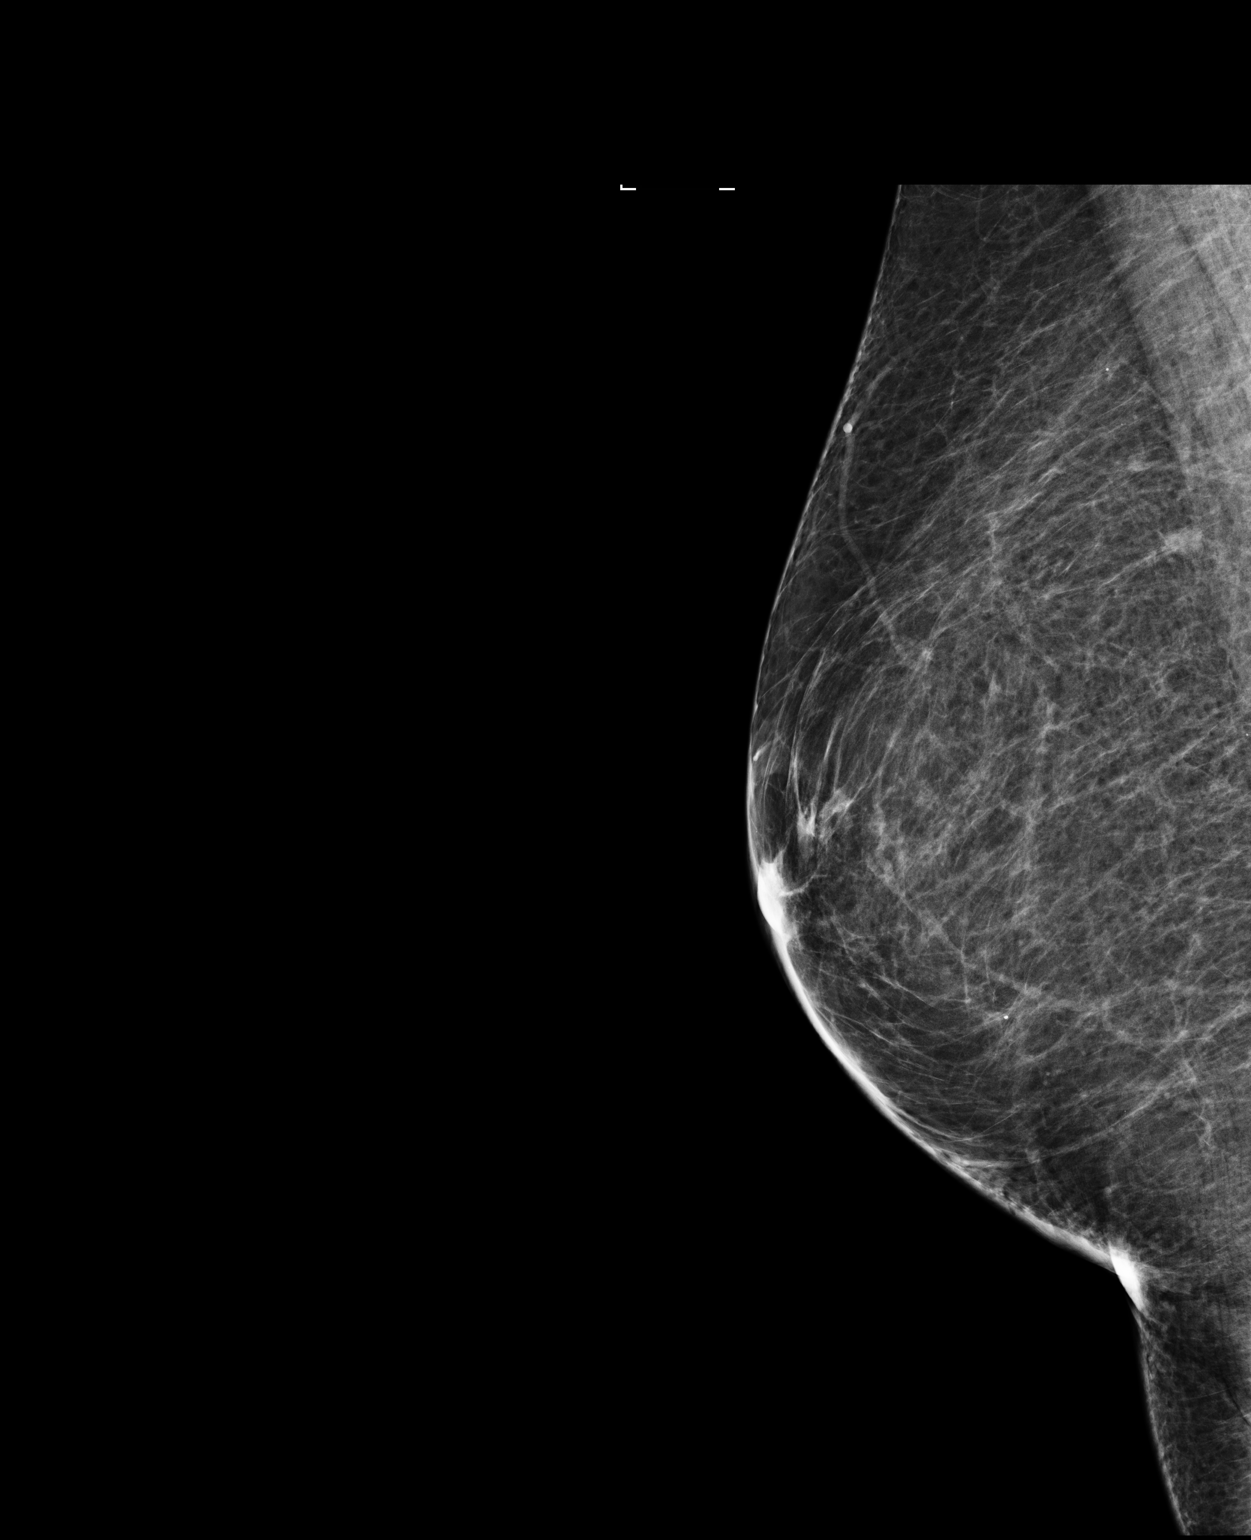

[3 of 3 positions shown; findings below may reference images not displayed]

ACR Breast Density Category b: There are scattered areas of
fibroglandular density.
FINDINGS: The patient has had a left mastectomy. There are no findings
suspicious for malignancy. Images were processed with CAD.
IMPRESSION: No mammographic evidence of malignancy. A result letter of this
screening mammogram will be mailed directly to the patient.

RECOMMENDATION:
Screening mammogram in one year.  (Code:[id number]-X-55W)

BI-RADS CATEGORY  1: Negative.

## 2020-11-27 ENCOUNTER — Other Ambulatory Visit: Payer: Self-pay | Admitting: Obstetrics and Gynecology

## 2020-11-27 DIAGNOSIS — Z1231 Encounter for screening mammogram for malignant neoplasm of breast: Secondary | ICD-10-CM

## 2021-01-27 ENCOUNTER — Ambulatory Visit: Payer: BC Managed Care – PPO

## 2023-05-25 DIAGNOSIS — R059 Cough, unspecified: Secondary | ICD-10-CM | POA: Diagnosis not present

## 2023-05-25 DIAGNOSIS — J069 Acute upper respiratory infection, unspecified: Secondary | ICD-10-CM | POA: Diagnosis not present

## 2023-06-29 ENCOUNTER — Encounter: Payer: Self-pay | Admitting: Physician Assistant

## 2023-06-29 ENCOUNTER — Other Ambulatory Visit: Payer: Self-pay | Admitting: Physician Assistant

## 2023-06-29 DIAGNOSIS — Z1231 Encounter for screening mammogram for malignant neoplasm of breast: Secondary | ICD-10-CM

## 2023-07-06 DIAGNOSIS — M2550 Pain in unspecified joint: Secondary | ICD-10-CM | POA: Diagnosis not present

## 2023-07-06 DIAGNOSIS — I10 Essential (primary) hypertension: Secondary | ICD-10-CM | POA: Diagnosis not present

## 2023-07-06 DIAGNOSIS — F3181 Bipolar II disorder: Secondary | ICD-10-CM | POA: Diagnosis not present

## 2023-07-06 DIAGNOSIS — F322 Major depressive disorder, single episode, severe without psychotic features: Secondary | ICD-10-CM | POA: Diagnosis not present

## 2023-07-06 DIAGNOSIS — R600 Localized edema: Secondary | ICD-10-CM | POA: Diagnosis not present

## 2023-07-06 DIAGNOSIS — E663 Overweight: Secondary | ICD-10-CM | POA: Diagnosis not present

## 2023-07-06 DIAGNOSIS — Z6829 Body mass index (BMI) 29.0-29.9, adult: Secondary | ICD-10-CM | POA: Diagnosis not present

## 2023-07-06 DIAGNOSIS — E039 Hypothyroidism, unspecified: Secondary | ICD-10-CM | POA: Diagnosis not present

## 2023-08-22 DIAGNOSIS — Z6830 Body mass index (BMI) 30.0-30.9, adult: Secondary | ICD-10-CM | POA: Diagnosis not present

## 2023-08-22 DIAGNOSIS — F3181 Bipolar II disorder: Secondary | ICD-10-CM | POA: Diagnosis not present

## 2023-08-22 DIAGNOSIS — F322 Major depressive disorder, single episode, severe without psychotic features: Secondary | ICD-10-CM | POA: Diagnosis not present

## 2023-08-22 DIAGNOSIS — G629 Polyneuropathy, unspecified: Secondary | ICD-10-CM | POA: Diagnosis not present

## 2023-08-22 DIAGNOSIS — Z0001 Encounter for general adult medical examination with abnormal findings: Secondary | ICD-10-CM | POA: Diagnosis not present

## 2023-08-22 DIAGNOSIS — R053 Chronic cough: Secondary | ICD-10-CM | POA: Diagnosis not present

## 2023-08-22 DIAGNOSIS — K219 Gastro-esophageal reflux disease without esophagitis: Secondary | ICD-10-CM | POA: Diagnosis not present

## 2023-08-22 DIAGNOSIS — E669 Obesity, unspecified: Secondary | ICD-10-CM | POA: Diagnosis not present

## 2023-08-22 DIAGNOSIS — M2550 Pain in unspecified joint: Secondary | ICD-10-CM | POA: Diagnosis not present

## 2023-08-22 DIAGNOSIS — Z23 Encounter for immunization: Secondary | ICD-10-CM | POA: Diagnosis not present

## 2023-08-22 DIAGNOSIS — E038 Other specified hypothyroidism: Secondary | ICD-10-CM | POA: Diagnosis not present

## 2023-08-22 DIAGNOSIS — I10 Essential (primary) hypertension: Secondary | ICD-10-CM | POA: Diagnosis not present

## 2023-08-23 ENCOUNTER — Other Ambulatory Visit: Payer: Self-pay | Admitting: Physician Assistant

## 2023-08-23 ENCOUNTER — Ambulatory Visit
Admission: RE | Admit: 2023-08-23 | Discharge: 2023-08-23 | Disposition: A | Discharge status: Home / Self Care | Payer: BC Managed Care – PPO | Source: Ambulatory Visit | Attending: Physician Assistant | Admitting: Physician Assistant

## 2023-08-23 DIAGNOSIS — Z1231 Encounter for screening mammogram for malignant neoplasm of breast: Secondary | ICD-10-CM | POA: Diagnosis not present

## 2023-08-30 DIAGNOSIS — E039 Hypothyroidism, unspecified: Secondary | ICD-10-CM | POA: Diagnosis not present

## 2023-09-14 ENCOUNTER — Other Ambulatory Visit (HOSPITAL_COMMUNITY): Payer: Self-pay

## 2023-09-14 DIAGNOSIS — R635 Abnormal weight gain: Secondary | ICD-10-CM | POA: Diagnosis not present

## 2023-09-14 DIAGNOSIS — R058 Other specified cough: Secondary | ICD-10-CM | POA: Diagnosis not present

## 2023-09-14 DIAGNOSIS — E7800 Pure hypercholesterolemia, unspecified: Secondary | ICD-10-CM | POA: Diagnosis not present

## 2023-09-14 DIAGNOSIS — R600 Localized edema: Secondary | ICD-10-CM | POA: Diagnosis not present

## 2023-09-27 ENCOUNTER — Ambulatory Visit (HOSPITAL_COMMUNITY)
Admission: RE | Admit: 2023-09-27 | Discharge: 2023-09-27 | Disposition: A | Discharge status: Home / Self Care | Payer: Self-pay | Source: Ambulatory Visit

## 2023-09-27 DIAGNOSIS — E7800 Pure hypercholesterolemia, unspecified: Secondary | ICD-10-CM | POA: Insufficient documentation

## 2023-10-11 DIAGNOSIS — R600 Localized edema: Secondary | ICD-10-CM | POA: Diagnosis not present

## 2023-10-11 DIAGNOSIS — E7800 Pure hypercholesterolemia, unspecified: Secondary | ICD-10-CM | POA: Diagnosis not present

## 2023-10-11 DIAGNOSIS — E039 Hypothyroidism, unspecified: Secondary | ICD-10-CM | POA: Diagnosis not present

## 2023-10-11 DIAGNOSIS — R635 Abnormal weight gain: Secondary | ICD-10-CM | POA: Diagnosis not present

## 2023-10-11 DIAGNOSIS — R7301 Impaired fasting glucose: Secondary | ICD-10-CM | POA: Diagnosis not present

## 2023-10-17 DIAGNOSIS — E7800 Pure hypercholesterolemia, unspecified: Secondary | ICD-10-CM | POA: Diagnosis not present

## 2023-10-17 DIAGNOSIS — E039 Hypothyroidism, unspecified: Secondary | ICD-10-CM | POA: Diagnosis not present

## 2023-10-17 DIAGNOSIS — R9389 Abnormal findings on diagnostic imaging of other specified body structures: Secondary | ICD-10-CM | POA: Diagnosis not present

## 2023-10-17 DIAGNOSIS — R635 Abnormal weight gain: Secondary | ICD-10-CM | POA: Diagnosis not present

## 2023-10-18 ENCOUNTER — Other Ambulatory Visit: Payer: Self-pay

## 2023-10-18 DIAGNOSIS — R9389 Abnormal findings on diagnostic imaging of other specified body structures: Secondary | ICD-10-CM

## 2023-10-31 ENCOUNTER — Other Ambulatory Visit

## 2023-10-31 DIAGNOSIS — M2550 Pain in unspecified joint: Secondary | ICD-10-CM | POA: Diagnosis not present

## 2023-10-31 DIAGNOSIS — M791 Myalgia, unspecified site: Secondary | ICD-10-CM | POA: Diagnosis not present

## 2023-10-31 DIAGNOSIS — M1990 Unspecified osteoarthritis, unspecified site: Secondary | ICD-10-CM | POA: Diagnosis not present

## 2023-10-31 DIAGNOSIS — E039 Hypothyroidism, unspecified: Secondary | ICD-10-CM | POA: Diagnosis not present

## 2023-11-01 ENCOUNTER — Other Ambulatory Visit: Payer: Self-pay | Admitting: Rheumatology

## 2023-11-01 ENCOUNTER — Ambulatory Visit
Admission: RE | Admit: 2023-11-01 | Discharge: 2023-11-01 | Disposition: A | Discharge status: Home / Self Care | Source: Ambulatory Visit | Attending: Rheumatology | Admitting: Rheumatology

## 2023-11-01 ENCOUNTER — Ambulatory Visit
Admission: RE | Admit: 2023-11-01 | Discharge: 2023-11-01 | Discharge status: Home / Self Care | Source: Ambulatory Visit

## 2023-11-01 DIAGNOSIS — M25551 Pain in right hip: Secondary | ICD-10-CM | POA: Diagnosis not present

## 2023-11-01 DIAGNOSIS — M160 Bilateral primary osteoarthritis of hip: Secondary | ICD-10-CM | POA: Diagnosis not present

## 2023-11-01 DIAGNOSIS — M2011 Hallux valgus (acquired), right foot: Secondary | ICD-10-CM | POA: Diagnosis not present

## 2023-11-01 DIAGNOSIS — M79671 Pain in right foot: Secondary | ICD-10-CM | POA: Diagnosis not present

## 2023-11-01 DIAGNOSIS — M25562 Pain in left knee: Secondary | ICD-10-CM | POA: Diagnosis not present

## 2023-11-01 DIAGNOSIS — M79641 Pain in right hand: Secondary | ICD-10-CM | POA: Diagnosis not present

## 2023-11-01 DIAGNOSIS — J9859 Other diseases of mediastinum, not elsewhere classified: Secondary | ICD-10-CM | POA: Diagnosis not present

## 2023-11-01 DIAGNOSIS — M1990 Unspecified osteoarthritis, unspecified site: Secondary | ICD-10-CM

## 2023-11-01 DIAGNOSIS — M25531 Pain in right wrist: Secondary | ICD-10-CM | POA: Diagnosis not present

## 2023-11-01 DIAGNOSIS — R9389 Abnormal findings on diagnostic imaging of other specified body structures: Secondary | ICD-10-CM

## 2023-11-01 DIAGNOSIS — M79642 Pain in left hand: Secondary | ICD-10-CM | POA: Diagnosis not present

## 2023-11-01 DIAGNOSIS — M25552 Pain in left hip: Secondary | ICD-10-CM | POA: Diagnosis not present

## 2023-11-01 DIAGNOSIS — M2012 Hallux valgus (acquired), left foot: Secondary | ICD-10-CM | POA: Diagnosis not present

## 2023-11-01 DIAGNOSIS — M25532 Pain in left wrist: Secondary | ICD-10-CM | POA: Diagnosis not present

## 2023-11-01 DIAGNOSIS — M25561 Pain in right knee: Secondary | ICD-10-CM | POA: Diagnosis not present

## 2023-11-01 DIAGNOSIS — M4316 Spondylolisthesis, lumbar region: Secondary | ICD-10-CM | POA: Diagnosis not present

## 2023-11-01 DIAGNOSIS — M79672 Pain in left foot: Secondary | ICD-10-CM | POA: Diagnosis not present

## 2023-11-01 MED ORDER — IOPAMIDOL (ISOVUE-300) INJECTION 61%
100.0000 mL | Freq: Once | INTRAVENOUS | Status: AC | PRN
  Administered 2023-11-01: 100 mL via INTRAVENOUS

## 2023-11-09 DIAGNOSIS — R600 Localized edema: Secondary | ICD-10-CM | POA: Diagnosis not present

## 2023-11-09 DIAGNOSIS — J9859 Other diseases of mediastinum, not elsewhere classified: Secondary | ICD-10-CM | POA: Diagnosis not present

## 2023-11-09 DIAGNOSIS — R635 Abnormal weight gain: Secondary | ICD-10-CM | POA: Diagnosis not present

## 2023-11-21 DIAGNOSIS — E7800 Pure hypercholesterolemia, unspecified: Secondary | ICD-10-CM | POA: Diagnosis not present

## 2023-11-21 DIAGNOSIS — R635 Abnormal weight gain: Secondary | ICD-10-CM | POA: Diagnosis not present

## 2023-11-21 DIAGNOSIS — E039 Hypothyroidism, unspecified: Secondary | ICD-10-CM | POA: Diagnosis not present

## 2023-11-21 DIAGNOSIS — M1990 Unspecified osteoarthritis, unspecified site: Secondary | ICD-10-CM | POA: Diagnosis not present

## 2023-11-24 DIAGNOSIS — K581 Irritable bowel syndrome with constipation: Secondary | ICD-10-CM | POA: Diagnosis not present

## 2023-11-24 DIAGNOSIS — J449 Chronic obstructive pulmonary disease, unspecified: Secondary | ICD-10-CM | POA: Diagnosis not present

## 2023-11-24 DIAGNOSIS — C50919 Malignant neoplasm of unspecified site of unspecified female breast: Secondary | ICD-10-CM | POA: Diagnosis not present

## 2023-11-24 DIAGNOSIS — F32A Depression, unspecified: Secondary | ICD-10-CM | POA: Diagnosis not present

## 2023-11-25 DIAGNOSIS — R29810 Facial weakness: Secondary | ICD-10-CM | POA: Diagnosis not present

## 2023-11-25 DIAGNOSIS — G510 Bell's palsy: Secondary | ICD-10-CM | POA: Diagnosis not present

## 2023-11-25 DIAGNOSIS — Z79899 Other long term (current) drug therapy: Secondary | ICD-10-CM | POA: Diagnosis not present

## 2023-11-25 DIAGNOSIS — Z87891 Personal history of nicotine dependence: Secondary | ICD-10-CM | POA: Diagnosis not present

## 2023-11-25 DIAGNOSIS — I10 Essential (primary) hypertension: Secondary | ICD-10-CM | POA: Diagnosis not present

## 2023-11-25 DIAGNOSIS — I6521 Occlusion and stenosis of right carotid artery: Secondary | ICD-10-CM | POA: Diagnosis not present

## 2023-11-25 DIAGNOSIS — I6522 Occlusion and stenosis of left carotid artery: Secondary | ICD-10-CM | POA: Diagnosis not present

## 2023-11-25 DIAGNOSIS — R220 Localized swelling, mass and lump, head: Secondary | ICD-10-CM | POA: Diagnosis not present

## 2023-11-25 NOTE — ED Provider Notes (Signed)
 Emergency Department Provider Note    ED Clinical Impression   Final diagnoses:  Bell's palsy (Primary)  Stenosis of left carotid artery    ED Assessment/Plan    Condition: Stable Disposition: Discharge  This chart has been completed using Dragon Medical Dictation software, and while attempts have been made to ensure accuracy, certain words and phrases may not be transcribed as intended.   History   Chief Complaint  Patient presents with  . Facial Swelling   HPI  Chelsey Thompson is a 63 y.o. female  who presents today to the  emergency department complaining of left-sided facial droop since yesterday morning.  Patient states that her left eye feels dry because she does have difficulty closing the eye.  She states she has had some episodes where she had difficulty swallowing.  She denies any visual changes.  She denies any speech changes.  She denies any tinnitus.  She describes symptoms as moderate.    Allergies: is allergic to sulfa (sulfonamide antibiotics). Medications: has a current medication list which includes the following long-term medication(s): amlodipine. PMHx:  has a past medical history of Alcoholism, Asthma (HHS-HCC), Breast cancer, Depression, Hypertension, and Migraine. PSHx:  has a past surgical history that includes Mastectomy (Left, 11/2006); Breast reconstruction (Left, 03/2007); breast reconstruction (Left, 10/2007); and Combined reduction mammaplasty w/ liposuction (Right, 10/2007). SocHx:  reports that she has quit smoking. She has never used smokeless tobacco. She reports that she does not drink alcohol and does not use drugs. Allergies, Medications, Medical, Surgical, and Social History were reviewed as documented above.   Social Drivers of Health with Concerns   Food Insecurity: Not on file  Tobacco Use: Medium Risk (11/25/2023)   Patient History   . Smoking  Tobacco Use: Former   . Smokeless Tobacco Use: Never   . Passive Exposure: Not on file  Transportation Needs: Not on file  Alcohol Use: Not on file  Housing: Not on file  Physical Activity: Not on file  Utilities: Not on file  Stress: Not on file  Interpersonal Safety: Not on file  Substance Use: Not on file (10/10/2023)  Intimate Partner Violence: Not on file  Social Connections: Not on file  Financial Resource Strain: Not on file  Internet Connectivity: Not on file     Review Of Systems  Review of Systems  Constitutional:  Negative for fever.  HENT:  Negative for congestion.   Respiratory:  Negative for chest tightness and shortness of breath.   Cardiovascular:  Negative for chest pain.  Gastrointestinal:  Negative for abdominal pain.  Skin:  Negative for color change.  Neurological:  Positive for facial asymmetry. Negative for seizures, speech difficulty, weakness and headaches.  Psychiatric/Behavioral:  Negative for behavioral problems.   All other systems reviewed and are negative.   Physical Exam   BP 142/76   Pulse 64   Temp 36.6 C (97.8 F) (Temporal)   Resp 18   Ht 167.6 cm (5' 6)   Wt 81.3 kg (179 lb 3.2 oz)   SpO2 99%   BMI 28.92 kg/m   Physical Exam Vitals and nursing note reviewed.  Constitutional:      General: She is not in acute distress. HENT:     Head: Normocephalic.   Eyes:     Conjunctiva/sclera: Conjunctivae  normal.    Cardiovascular:     Rate and Rhythm: Regular rhythm.     Pulses: Normal pulses.     Heart sounds: Normal heart sounds.  Pulmonary:     Effort: No respiratory distress.     Breath sounds: Normal breath sounds.  Abdominal:     General: There is no distension.   Musculoskeletal:        General: No deformity.   Skin:    General: Skin is warm.     Capillary Refill: Capillary refill takes 2 to 3 seconds.     Comments: Normal cap refill.   Neurological:     Mental Status: She is oriented to person, place, and  time.     Cranial Nerves: Cranial nerve deficit present.     Motor: No weakness.     Coordination: Coordination normal.     Comments: This is left facial nerve palsy with some flattening of the left palpebral fissure and ptosis, suggestive of Bell's palsy.  Neuroexam is otherwise unremarkable.  GCS is 15.  NIH stroke score is 1.  Psychiatric:        Mood and Affect: Mood normal.     ED Course  Medical Decision Making Clinically patient suggestive of Bell's palsy.  However covering representative, will get CTA head and neck to rule out CVA.  3:42 PM CTA reviewed.  Given findings, will consult neurology.  Patient's care discussed with Dr. Roslynn, teleneurology.  He will see patient.  4:01 PM Patient has been seen by teleneurologist.  He agrees with diagnosis of Bell's palsy.  Commands started treatment with acyclovir and prednisone.  Will going to start patient on Valtrex.  4:24 PM Patient is doing well.  She is stable for discharge.  I have reviewed my clinical findings and studies and my clinical impression with the patient. The patient has expressed understanding that at this time there is no evidence for a more malignant underlying process, but the patient also understands that early in the process of a condition such as this, an initial workup can be falsely reassuring. I have counseled the patient and discussed follow-up with the patient, stressing the importance of appropriate follow-up. I have also counseled the patient to return if worse or any concerns. Routine discharge counseling was given to the patient and the patient understands that worsening, changing or persistent symptoms should prompt an immediate call or follow up with their primary physician or return to the emergency department for reevaluation. Patient has expressed understanding.     Problems Addressed: Bell's palsy: acute illness or injury that poses a threat to life or bodily functions Stenosis of left carotid  artery: chronic illness or injury  Amount and/or Complexity of Data Reviewed Labs: ordered. Decision-making details documented in ED Course. Radiology: ordered. Decision-making details documented in ED Course. Discussion of management or test interpretation with external provider(s): Patient's care discussed with neurologist.  Risk Prescription drug management. Decision regarding hospitalization.     Procedures   No results found for this visit on 11/25/23 (from the past 4464 hours).   ED Results Results for orders placed or performed during the hospital encounter of 11/25/23  Magnesium  Result Value Ref Range   Magnesium 2.1 1.6 - 2.6 mg/dL  Basic Metabolic Panel  Result Value Ref Range   Sodium 138 135 - 145 mmol/L   Potassium 2.7 (L) 3.5 - 5.0 mmol/L   Chloride 97 (L) 98 - 107 mmol/L   CO2 32.5 (H) 21.0 - 32.0  mmol/L   Anion Gap 9 3 - 11 mmol/L   BUN 12 8 - 20 mg/dL   Creatinine 9.47 (L) 9.39 - 1.10 mg/dL   BUN/Creatinine Ratio 23    eGFR CKD-EPI (2021) Female >90 >=60 mL/min/1.53m2   Glucose 88 70 - 179 mg/dL   Calcium 9.7 8.5 - 89.8 mg/dL  CBC w/ Differential  Result Value Ref Range   WBC 5.4 4.0 - 10.5 10*9/L   RBC 4.21 3.80 - 5.10 10*12/L   HGB 13.2 11.5 - 15.0 g/dL   HCT 63.0 65.9 - 55.9 %   MCV 87.6 80.0 - 98.0 fL   MCH 31.4 27.0 - 34.0 pg   MCHC 35.8 32.0 - 36.0 g/dL   RDW 87.6 88.4 - 85.4 %   MPV 11.8 (H) 7.4 - 10.4 fL   Platelet 180 140 - 415 10*9/L   Neutrophils % 46.9 %   Lymphocytes % 42.5 %   Monocytes % 8.3 %   Eosinophils % 1.5 %   Basophils % 0.6 %   Absolute Neutrophils 2.6 1.8 - 7.8 10*9/L   Absolute Lymphocytes 2.3 0.7 - 4.5 10*9/L   Absolute Monocytes 0.5 0.1 - 1.0 10*9/L   Absolute Eosinophils 0.1 0.0 - 0.4 10*9/L   Absolute Basophils 0.0 0.0 - 0.2 10*9/L   CTA Head And Neck W Contrast Result Date: 11/25/2023 Exam: CT Angiogram of the Neck and Head with Contrast  History:  facial droop ? CVA  Technique: CTA through the head and neck  with IV contrast.  Image postprocessing and 3-D reconstructions at an independent workstation is performed and the rendered images that are generated are sent to the patient's image file in PACS. AEC (automated exposure control) and/or manual techniques such as size-specific kV and mAs are employed where appropriate to reduce radiation exposure for all CT exams.  COMMENT:  All measurements of stenosis in the report above were calculated using the NASCET [facility] American Symptomatic Carotid Endarterectomy Trial) methodology for measuring carotid artery stenosis, whereby the narrowest transverse measurement of luminal dimension at the point of the maximum stenosis is stated relative to the diameter of the closest normal-appearing portion of the same vessel beyond the stenosis.  IV contrast: 75 ml of Omnipaque 350.  Comparison:  None.  Findings:  Arch & Subclavian Arteries: Unremarkable aortic arch. Subclavian arteries are normal bilaterally. Common Carotids: No hemodynamically significant stenosis. ICAs:    - Right ICA: Atherosclerotic disease at the carotid bifurcation/proximal right ICA without hemodynamically significant stenosis. Dorsal wall plaque versus ulcerated plaque at the proximal right ICA (6: 415).    - Left ICA: No hemodynamically significant stenosis. MCAs: Mild irregularity and narrowing of bilateral M1 MCAs. Focal high-grade stenosis of a proximal right superior terminal branch (M2) (11:17, 6: 215). Distal reconstitution. ACAs:  Normal bilaterally. P-Comms: Unremarkable Vertebral arteries: No hemodynamically significant stenosis. Basilar artery: Normal. PCAs: Normal bilaterally. SCAs: Patent proximally PICAs: Patent proximally     1.    Focal high-grade stenosis of a proximal right superior terminal branch (M2). 2.    Mild irregularity and narrowing of bilateral M1 MCAs. 3.    Atherosclerotic disease at the carotid bifurcation/proximal right ICA without hemodynamically significant stenosis. Ulcerated  plaque at the proximal right ICA.  Signed (Electronic Signature): 11/25/2023 3:16 PM Signed By: Eva Stallion, MD  CT head without contrast Result Date: 11/25/2023 Exam:  CT Head without Contrast  History:  Vision change  Technique: Routine brain CT without IV contrast. AEC (automated exposure control) and/or  manual techniques such as size-specific kV and mAs are employed where appropriate to reduce radiation exposure for all CT exams.  Comparison:  2015  Findings:   BRAIN:  No CT evidence of acute infarction, hemorrhage, edema, mass or mass effect. Ventricles and basilar cisterns are unremarkable.  SOFT TISSUES:  Negative. CALVARIUM:  Negative. No fracture. SINUSES AND MASTOIDS:  No mucosal thickening or fluid.    No acute intracranial abnormality.  Signed (Electronic Signature): 11/25/2023 2:51 PM Signed By: Dow JAYSON Agee, MD   Medications Administered:  Medications  predniSONE (DELTASONE) tablet 60 mg (60 mg Oral Given 11/25/23 1350)  iohexol (OMNIPAQUE) 350 mg iodine/mL solution 75 mL (75 mL Intravenous Given 11/25/23 1448)  potassium chloride ER tablet 40 mEq (40 mEq Oral Given 11/25/23 1516)  valACYclovir (VALTREX) tablet 500 mg (500 mg Oral Given 11/25/23 1623)    Discharge Medications (Medications Prescribed during this  ED visit and Patient's Home Medications) :    Your Medication List     START taking these medications    predniSONE 20 MG tablet Commonly known as: DELTASONE Take 3 tablets (60 mg total) by mouth daily for 10 days.   REFRESH LACRI-LUBE 56.8-42.5 % Oint Generic drug: white petrolatum-mineral oil Administer 1 Application into the left eye daily as needed (Eye dryness.). Use at night before you go to bed and as often as needed during the day for eye dryness   valACYclovir 1000 MG tablet Commonly known as: VALTREX Take 1 tablet (1,000 mg total) by mouth Three (3) times a day for 10 days.       ASK your doctor about these medications    amlodipine 2.5 MG  tablet Commonly known as: NORVASC amlodipine 2.5 mg tablet  TAKE 1 TABLET BY MOUTH EVERY DAY   benzonatate 100 MG capsule Commonly known as: TESSALON benzonatate 100 mg capsule  TK 1 C PO TID PRN UNTIL DIRECTED TO STOP   linaclotide 72 mcg capsule Commonly known as: LINZESS Take 1 capsule (72 mcg total) by mouth daily.          Cherie Ardeen Hanger, MD 11/25/23 [phone]

## 2023-11-25 NOTE — ED Notes (Signed)
 Pt discharged home with family.  Pt verbalized understanding of discharge instructions.  IV removed, Pt A&O x4 with steady gait and in no respiratory distress upon discharge.

## 2023-11-28 DIAGNOSIS — D631 Anemia in chronic kidney disease: Secondary | ICD-10-CM | POA: Diagnosis not present

## 2023-11-28 DIAGNOSIS — R7303 Prediabetes: Secondary | ICD-10-CM | POA: Diagnosis not present

## 2023-11-28 DIAGNOSIS — N189 Chronic kidney disease, unspecified: Secondary | ICD-10-CM | POA: Diagnosis not present

## 2023-11-28 DIAGNOSIS — I10 Essential (primary) hypertension: Secondary | ICD-10-CM | POA: Diagnosis not present

## 2023-11-28 DIAGNOSIS — E669 Obesity, unspecified: Secondary | ICD-10-CM | POA: Diagnosis not present

## 2023-11-28 DIAGNOSIS — E119 Type 2 diabetes mellitus without complications: Secondary | ICD-10-CM | POA: Diagnosis not present

## 2023-11-28 DIAGNOSIS — E039 Hypothyroidism, unspecified: Secondary | ICD-10-CM | POA: Diagnosis not present

## 2023-11-29 ENCOUNTER — Institutional Professional Consult (permissible substitution) (INDEPENDENT_AMBULATORY_CARE_PROVIDER_SITE_OTHER): Admitting: Otolaryngology

## 2023-11-30 DIAGNOSIS — E039 Hypothyroidism, unspecified: Secondary | ICD-10-CM | POA: Diagnosis not present

## 2023-12-12 DIAGNOSIS — M545 Low back pain, unspecified: Secondary | ICD-10-CM | POA: Diagnosis not present

## 2023-12-19 ENCOUNTER — Encounter: Payer: Self-pay | Admitting: Thoracic Surgery (Cardiothoracic Vascular Surgery)

## 2023-12-19 ENCOUNTER — Other Ambulatory Visit: Payer: Self-pay

## 2023-12-19 ENCOUNTER — Ambulatory Visit
Attending: Thoracic Surgery (Cardiothoracic Vascular Surgery) | Admitting: Thoracic Surgery (Cardiothoracic Vascular Surgery)

## 2023-12-19 ENCOUNTER — Other Ambulatory Visit: Payer: Self-pay | Admitting: Thoracic Surgery (Cardiothoracic Vascular Surgery)

## 2023-12-19 VITALS — BP 114/77 | HR 79 | Resp 20 | Ht 63.25 in | Wt 193.0 lb

## 2023-12-19 DIAGNOSIS — J9859 Other diseases of mediastinum, not elsewhere classified: Secondary | ICD-10-CM

## 2023-12-19 NOTE — Progress Notes (Signed)
 PCP is Alm Ozell POUR, DO Referring Provider is Caleen Repine, PA-C  Chief Complaint  Patient presents with   Mediastinal Mass    Surgical consult, Cardiac CT 09/27/23/ Chest CT 11/01/23    HPI: Chelsey Thompson is sent for consultation regarding an anterior mediastinal mass  Chelsey Thompson is a 63 year old woman with a history of hypertension, hyperlipidemia, fibromyalgia, DCIS of the left breast, left mastectomy with reconstruction, and hypothyroidism.  She has been feeling poorly for about 6 months.  Intermittently has swelling in her legs.  No chest pain, pressure, or tightness.  Also has noted some intermittent difficulty swallowing.  She was concerned it might be her kidneys or her heart.  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  She had a CT of the chest in June which confirmed the mass.  She presented to the emergency room in late June with left-sided facial drooping.  Mostly drooping of the eyelid.  She did not have any issues with her smile.  Diagnosed with Bell's palsy and was treated with steroids.  Symptoms did improve with steroids.  Zubrod Score: At the time of surgery this patient's most appropriate activity status/level should be described as: []     0    Normal activity, no symptoms [x]     1    Restricted in physical strenuous activity but ambulatory, able to do out light work []     2    Ambulatory and capable of self care, unable to do work activities, up and about >50 % of waking hours                              []     3    Only limited self care, in bed greater than 50% of waking hours []     4    Completely disabled, no self care, confined to bed or chair []     5    Moribund   Past Medical History:  Diagnosis Date   Ductal carcinoma in situ (DCIS) of left breast 2008   s/p mastectomy, reconstruction   Fibromyalgia    Frequent headaches    HLD (hyperlipidemia)    Hypertension    Hypothyroidism     Past  Surgical History:  Procedure Laterality Date   BREAST SURGERY Left    Cancer and implant   MASTECTOMY Left    REDUCTION MAMMAPLASTY Right     Family History  Problem Relation Age of Onset   COPD Mother    Cancer Father    Breast cancer Neg Hx     Social History Social History   Tobacco Use   Smoking status: Former    Current packs/day: 0.00    Types: Cigarettes    Quit date: 05/31/1987    Years since quitting: 36.5   Smokeless tobacco: Never  Substance Use Topics   Alcohol use: Yes    Comment: Drank heavily for 14 years.   Drug use: No    Current Outpatient Medications  Medication Sig Dispense Refill   amLODipine-benazepril (LOTREL) 5-20 MG per capsule Take 1 capsule by mouth daily.     hydrochlorothiazide (HYDRODIURIL) 25 MG tablet Take 25 mg by mouth daily.     SYNTHROID 25 MCG tablet Take 50 mcg by mouth daily before breakfast.     No current facility-administered medications for this visit.    Allergies  Allergen Reactions  Sulfa Antibiotics     Rash?    Review of Systems  Constitutional:  Positive for activity change and unexpected weight change (has gained 40 pounds in 6 months). Negative for appetite change and fatigue.  HENT:  Positive for trouble swallowing.   Eyes:        Drooping left eyelid  Respiratory:  Negative for shortness of breath.   Cardiovascular:  Positive for leg swelling. Negative for chest pain.  Neurological:  Positive for headaches. Negative for weakness.  All other systems reviewed and are negative.   BP 114/77   Pulse 79   Resp 20   Ht 5' 3.25 (1.607 m)   Wt 193 lb (87.5 kg)   LMP 02/03/2015   BMI 33.92 kg/m  Physical Exam Vitals reviewed.  Constitutional:      General: She is not in acute distress.    Appearance: Normal appearance.  HENT:     Head: Normocephalic and atraumatic.  Eyes:     General: No scleral icterus.    Extraocular Movements: Extraocular movements intact.     Comments: Mild left ptosis   Cardiovascular:     Rate and Rhythm: Normal rate and regular rhythm.     Pulses: Normal pulses.     Heart sounds: Normal heart sounds. No murmur heard.    No friction rub. No gallop.  Pulmonary:     Effort: Pulmonary effort is normal. No respiratory distress.     Breath sounds: Normal breath sounds. No wheezing or rales.  Abdominal:     General: There is no distension.     Palpations: Abdomen is soft.  Musculoskeletal:     Cervical back: Neck supple.  Lymphadenopathy:     Cervical: No cervical adenopathy.  Skin:    General: Skin is warm and dry.  Neurological:     General: No focal deficit present.     Mental Status: She is alert and oriented to person, place, and time.     Cranial Nerves: No cranial nerve deficit.     Motor: No weakness.    Diagnostic Tests: CT CHEST WITH CONTRAST   TECHNIQUE: Multidetector CT imaging of the chest was performed during intravenous contrast administration.   RADIATION DOSE REDUCTION: This exam was performed according to the departmental dose-optimization program which includes automated exposure control, adjustment of the mA and/or kV according to patient size and/or use of iterative reconstruction technique.   CONTRAST:  100mL ISOVUE -300 IOPAMIDOL  (ISOVUE -300) INJECTION 61%   COMPARISON:  Partial comparison to cardiac CT dated 09/27/2023   FINDINGS: Cardiovascular: Heart is normal in size.  No pericardial effusion.   No evidence of thoracic aortic aneurysm. Mild atherosclerotic calcifications of the aortic arch.   Mild three-vessel coronary atherosclerosis.   Mediastinum/Nodes: No suspicious mediastinal lymphadenopathy.   1.7 x 2.2 cm soft tissue lesion along the right anterior mediastinum with peripheral calcification (image 58), raising concern for an anterior mediastinal mass such as thymoma.   Visualized thyroid  is unremarkable.   Lungs/Pleura: No suspicious pulmonary nodules.   Mild scarring in the bilateral lower  lobes and medial right middle lobe.   No focal consolidation.   Mild centrilobular emphysematous changes, upper lung predominant.   Upper Abdomen: Visualized upper abdomen is grossly unremarkable.   Musculoskeletal: Status post left mastectomy with reconstruction. Mild midthoracic dextroscoliosis.   IMPRESSION: 2.2 cm soft tissue lesion along the right anterior mediastinum, raising concern for an anterior mediastinal mass such as thymoma. Consider thoracic surgery consultation for further evaluation.  Status post left mastectomy. No evidence of recurrent or metastatic disease.   Aortic Atherosclerosis (ICD10-I70.0) and Emphysema (ICD10-J43.9).     Electronically Signed   By: Chelsey Thompson M.D.   On: 11/02/2023 23:02 I personally reviewed the CT images there is a 2.3 x 1.7 cm soft tissue mass in the anterior mediastinum on the right.  Minimal peripheral calcification.  Mild coronary atherosclerosis and aortic atherosclerosis.  Impression: Chelsey Thompson is a 63 year old woman with a history of hypertension, hyperlipidemia, fibromyalgia, DCIS of the left breast, left mastectomy with reconstruction, and hypothyroidism.  Presented with weight gain, difficulty swallowing, and drooping left eyelid.  Was found to have an anterior mediastinal mass on CT.  Differential diagnosis of anterior midsternal mass includes thymic tumors, teratoma, lymphoma, and ectopic thyroid .  In her case her symptoms are highly suggestive of myasthenia gravis and this almost certainly is a thymic tumor, and has to be considered that unless can be proven otherwise.  I reviewed the CT images with Chelsey Thompson.  We discussed management of thymic tumors and myasthenia gravis.  First order business is to determine whether she has myasthenia.  Will order acetylcholine receptor binding, blocking, and modulating antibodies.  Urgent referral to neurology for consultation.  Anterior mediastinal mass-I recommended  that once we have established that she has myasthenia the plan would be to do a resection of the anterior mediastinal mass.  Plan would be to do a robotic right VATS approach.  I informed her of the general nature of the procedure including the need for general anesthesia, the incisions to be used, and use the surgical robot, expected hospital stay, and the overall recovery.  I informed her of the indications, risks, benefits, and alternatives.  She understands the risks include, but not limited to death, MI, DVT, PE, bleeding, possible need for transfusion, infection, cardiac arrhythmias, recurrent or phrenic nerve injury, myasthenic crisis, as well as possibility of other unforeseeable complications.  Plan: Serial choline antibodies Neurology consultation Follow-up in about 3 weeks to discuss timing of surgery  [name], MD [facility] [phone]

## 2023-12-21 ENCOUNTER — Telehealth: Payer: Self-pay

## 2023-12-21 NOTE — Telephone Encounter (Signed)
 Patient contacted the office requesting her blood work results. Some lab work has come back but not all. Advised patient that if all blood work comes back before her follow-up appointment the office/ Dr. Kerrin would give her a call back with advise. She acknowledged receipt.

## 2023-12-23 LAB — ACETYLCHOLINE RECEPTOR, BINDING: AChR Binding Ab, Serum: 4.56 nmol/L — ABNORMAL HIGH (ref 0.00–0.24)

## 2023-12-23 LAB — ACETYLCHOLINE RECEPTOR, BLOCKING: Acetylchol Block Ab: 44 % — ABNORMAL HIGH (ref 0–25)

## 2023-12-25 ENCOUNTER — Ambulatory Visit: Attending: Cardiovascular Disease | Admitting: Cardiovascular Disease

## 2023-12-25 ENCOUNTER — Encounter: Payer: Self-pay | Admitting: Cardiovascular Disease

## 2023-12-25 ENCOUNTER — Telehealth: Payer: Self-pay | Admitting: Pharmacist

## 2023-12-25 VITALS — BP 118/84 | HR 68 | Ht 66.0 in | Wt 198.0 lb

## 2023-12-25 DIAGNOSIS — R079 Chest pain, unspecified: Secondary | ICD-10-CM | POA: Insufficient documentation

## 2023-12-25 DIAGNOSIS — I10 Essential (primary) hypertension: Secondary | ICD-10-CM

## 2023-12-25 DIAGNOSIS — R0609 Other forms of dyspnea: Secondary | ICD-10-CM

## 2023-12-25 DIAGNOSIS — E782 Mixed hyperlipidemia: Secondary | ICD-10-CM | POA: Diagnosis not present

## 2023-12-25 DIAGNOSIS — E785 Hyperlipidemia, unspecified: Secondary | ICD-10-CM | POA: Insufficient documentation

## 2023-12-25 DIAGNOSIS — R931 Abnormal findings on diagnostic imaging of heart and coronary circulation: Secondary | ICD-10-CM | POA: Insufficient documentation

## 2023-12-25 NOTE — Assessment & Plan Note (Signed)
 History of hyperlipidemia lipid profile performed 10/11/2023 revealing total cholesterol of 239, LDL 126 and HDL of 103.  She wishes not to be on statin therapy.  She is not at goal for secondary prevention given her elevated coronary calcium score.  I am going to refer her to our Pharm.D.'s to discuss nonstatin alternatives such as PCSK9.

## 2023-12-25 NOTE — Patient Instructions (Signed)
 Medication Instructions:  Your physician recommends that you continue on your current medications as directed. Please refer to the Current Medication list given to you today.  *If you need a refill on your cardiac medications before your next appointment, please call your pharmacy*   Testing/Procedures: Your physician has requested that you have an echocardiogram. Echocardiography is a painless test that uses sound waves to create images of your heart. It provides your doctor with information about the size and shape of your heart and how well your heart's chambers and valves are working. This procedure takes approximately one hour. There are no restrictions for this procedure. Please do NOT wear cologne, perfume, aftershave, or lotions (deodorant is allowed). Please arrive 15 minutes prior to your appointment time.  Please note: We ask at that you not bring children with you during ultrasound (echo/ vascular) testing. Due to room size and safety concerns, children are not allowed in the ultrasound rooms during exams. Our front office staff cannot provide observation of children in our lobby area while testing is being conducted. An adult accompanying a patient to their appointment will only be allowed in the ultrasound room at the discretion of the ultrasound technician under special circumstances. We apologize for any inconvenience.   Follow-Up: At [facility], you and your health needs are our priority.  As part of our continuing mission to provide you with exceptional heart care, our providers are all part of one team.  This team includes your primary Cardiologist (physician) and Advanced Practice Providers or APPs (Physician Assistants and Nurse Practitioners) who all work together to provide you with the care you need, when you need it.  Your next appointment:   6 month(s)  Provider:   Dorn Lesches, MD    We recommend signing up for the patient portal called MyChart.  Sign  up information is provided on this After Visit Summary.  MyChart is used to connect with patients for Virtual Visits (Telemedicine).  Patients are able to view lab/test results, encounter notes, upcoming appointments, etc.  Non-urgent messages can be sent to your provider as well.   To learn more about what you can do with MyChart, go to [email or web address].   Other Instructions We will make you an appointment to discuss cholesterol therapy with a clinical pharmacist.

## 2023-12-25 NOTE — Assessment & Plan Note (Signed)
 History of essential pretension with blood pressure measured today at 119/84.  She is on hydrochlorothiazide.

## 2023-12-25 NOTE — Telephone Encounter (Signed)
 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 3:30 doesn't work for her. Patient asking if it's possible to do a virtual visit.

## 2023-12-25 NOTE — Assessment & Plan Note (Signed)
 BMI of 32.  She has gained 40 pounds over the last 8 to 10 months for unclear reasons.  I am going to refer her to the [facility] for weight loss.

## 2023-12-25 NOTE — Assessment & Plan Note (Signed)
 Patient has had infrequent chest pain occurring about once a month with left upper extremity radiation for the last 10 years.  This has not changed in frequency or severity.

## 2023-12-25 NOTE — Assessment & Plan Note (Signed)
 Coronary calcium score performed 09/27/23 was 145 mostly in the LAD and RCA.  She does complain of some dyspnea and has occasional infrequent chest pain which she has had on a monthly basis for the last 10 years.

## 2023-12-25 NOTE — Progress Notes (Signed)
 12/25/2023 Raimi Guillermo Elrod   March 11, 1961  [id number]  Primary Physician [name], NP Primary Cardiologist: Dorn JINNY Lesches MD [facility] [place], [place]  HPI:  Chelsey Thompson is a 63 y.o. moderately overweight married Caucasian female mother of 2 children who is self-referred because of symptoms of lower extremity edema and family history.  She is followed up at [facility].  Her primary provider has been Pagan Corlis, NP.  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  Risk factors include treated hypertension and untreated hyperlipidemia.  Her father did have a heart attack at age 65 which she survived.  She is never had a heart attack or stroke.  She does complain of some dyspnea but has gained 40 pounds over the last 8 to 10 months for unclear reasons.  She gets infrequent chest pain with left upper extremity radiation occurring approxi once a month for the last 10 years unchanged in frequency or severity.  She does walk 8-10,000 steps a day.  She had a coronary calcium score performed 10/05/2023 which was 145 distributed in the LAD and RCA.  She also had a retrosternal mass and is seeing Dr. Kerrin for this.  Is a question of whether she has myasthenia gravis as well.  She is scheduled to see a neurologist in the near future.   Current Meds  Medication Sig   hydrochlorothiazide (HYDRODIURIL) 25 MG tablet Take 25 mg by mouth daily.   SYNTHROID 25 MCG tablet Take 50 mcg by mouth daily before breakfast.     Allergies  Allergen Reactions   Sulfa Antibiotics     Rash?    Social History   Socioeconomic History   Marital status: Married    Spouse name: Not on file   Number of children: Not on file   Years of education: Not on file   Highest education level: Not on file  Occupational History   Not on file  Tobacco Use   Smoking status: Former    Current packs/day: 0.00    Types: Cigarettes    Quit date: 05/31/1987    Years since quitting: 36.5    Smokeless tobacco: Never  Substance and Sexual Activity   Alcohol use: Yes    Comment: Drank heavily for 14 years.   Drug use: No   Sexual activity: Not on file  Other Topics Concern   Not on file  Social History Narrative   Not on file   Social Drivers of Health   Financial Resource Strain: Not on file  Food Insecurity: Not on file  Transportation Needs: Not on file  Physical Activity: Not on file  Stress: Not on file  Social Connections: Not on file  Intimate Partner Violence: Not on file     Review of Systems: General: negative for chills, fever, night sweats or weight changes.  Cardiovascular: negative for chest pain, dyspnea on exertion, edema, orthopnea, palpitations, paroxysmal nocturnal dyspnea or shortness of breath Dermatological: negative for rash Respiratory: negative for cough or wheezing Urologic: negative for hematuria Abdominal: negative for nausea, vomiting, diarrhea, bright red blood per rectum, melena, or hematemesis Neurologic: negative for visual changes, syncope, or dizziness All other systems reviewed and are otherwise negative except as noted above.    Blood pressure 118/84, pulse 68, height 5' 6 (1.676 m), weight 198 lb (89.8 kg), last menstrual period 02/03/2015, SpO2 94%.  General appearance: alert and no distress Neck: no adenopathy, no carotid bruit, no JVD, supple,  symmetrical, trachea midline, and thyroid  not enlarged, symmetric, no tenderness/mass/nodules Lungs: clear to auscultation bilaterally Heart: regular rate and rhythm, S1, S2 normal, no murmur, click, rub or gallop Extremities: extremities normal, atraumatic, no cyanosis or edema Pulses: 2+ and symmetric Skin: Skin color, texture, turgor normal. No rashes or lesions Neurologic: Grossly normal  EKG EKG Interpretation Date/Time:  Monday December 25 2023 09:29:10 EDT Ventricular Rate:  68 PR Interval:  150 QRS Duration:  76 QT Interval:  374 QTC Calculation: 397 R  Axis:   49  Text Interpretation: Normal sinus rhythm Nonspecific ST abnormality When compared with ECG of 14-Dec-2006 11:47, No significant change was found Confirmed by Court Carrier [phone]) on 12/25/2023 9:56:50 AM    ASSESSMENT AND PLAN:   Hypertension History of essential pretension with blood pressure measured today at 119/84.  She is on hydrochlorothiazide.  Chest pain of uncertain etiology Patient has had infrequent chest pain occurring about once a month with left upper extremity radiation for the last 10 years.  This has not changed in frequency or severity.  Hyperlipidemia History of hyperlipidemia lipid profile performed 10/11/2023 revealing total cholesterol of 239, LDL 126 and HDL of 103.  She wishes not to be on statin therapy.  She is not at goal for secondary prevention given her elevated coronary calcium score.  I am going to refer her to our Pharm.D.'s to discuss nonstatin alternatives such as PCSK9.  Elevated coronary artery calcium score Coronary calcium score performed 09/27/23 was 145 mostly in the LAD and RCA.  She does complain of some dyspnea and has occasional infrequent chest pain which she has had on a monthly basis for the last 10 years.  Morbid obesity (HCC) BMI of 32.  She has gained 40 pounds over the last 8 to 10 months for unclear reasons.  I am going to refer her to the [facility] wellness center for weight loss.     Carrier DOROTHA Court MD FACP,FACC,FAHA, [facility] 12/25/2023 10:14 AM

## 2023-12-26 ENCOUNTER — Encounter: Payer: Self-pay | Admitting: Neurology

## 2023-12-26 ENCOUNTER — Ambulatory Visit: Admitting: Neurology

## 2023-12-26 VITALS — BP 124/82 | HR 62 | Ht 66.0 in | Wt 197.0 lb

## 2023-12-26 DIAGNOSIS — Z1151 Encounter for screening for human papillomavirus (HPV): Secondary | ICD-10-CM | POA: Diagnosis not present

## 2023-12-26 DIAGNOSIS — Z01419 Encounter for gynecological examination (general) (routine) without abnormal findings: Secondary | ICD-10-CM | POA: Diagnosis not present

## 2023-12-26 DIAGNOSIS — Z853 Personal history of malignant neoplasm of breast: Secondary | ICD-10-CM | POA: Diagnosis not present

## 2023-12-26 DIAGNOSIS — Z6831 Body mass index (BMI) 31.0-31.9, adult: Secondary | ICD-10-CM | POA: Diagnosis not present

## 2023-12-26 DIAGNOSIS — G7001 Myasthenia gravis with (acute) exacerbation: Secondary | ICD-10-CM

## 2023-12-26 MED ORDER — PYRIDOSTIGMINE BROMIDE 60 MG PO TABS: ORAL_TABLET | ORAL | 5 refills | Status: DC

## 2023-12-26 NOTE — Progress Notes (Signed)
 [facility] Clinic Note - Initial Visit   Date: 12/26/2023   Chelsey Thompson MRN: [id number] DOB: 1960-08-30   Dear Dr. Kerrin:   Thank you for your kind referral of [name] for consultation of myasthenia gravis. Although her history is well known to you, please allow us  to reiterate it for the purpose of our medical record. The patient was accompanied to the clinic by self.   Chelsey Thompson is a 63 y.o. right-handed female with hypothyroidism and hypertension presenting for evaluation of myasthenia gravis.   IMPRESSION/PLAN: Seropositive positive myasthenia gravis, thymoma positive.  She reports intermittent diplopia, dysphagia, left ptosis, and generalized fatigue.  On exam she has mild fatigable ptosis, no limb weakness.  I had an extensive discussion with the patient regarding the diagnosis, pathogenesis, prognosis, management plan, meds to avoid, and potential crisis myasthenia gravis. Medications including mestinon  and prednisone were discussed.  She prefers not to take prednisone due to potential side effects. Since her symptoms are mild, it is reasonable to see how she responds to mestinon .  I agree with proceeding with thymectomy as this is also likely to improve her symptoms.   - Start mestinon  60mg  three times daily  - Prednisone declined  - List provided of medications to avoid discussed  - MyChart update in 1 week  Return to clinic in 1 month   ------------------------------------------------------------- History of present illness: For the past several months she has been having intermittent double vision described as images side-by-side.  Closing one eye would resolve her double vision.  In May, she began having difficulty with solids.  No choking spells.  In June, she went to the ER with left ptosis and was treated with valcyclovir and prednisone for suspected Bell's palsy, which improved her symptoms for 2 weeks.  After  finishing her prednisone, her left eye became droopy again.    She reports gaining 40lb since November and was having chronic dry cough which prompted further evaluation and ultimately had CT chest which shows mediastinal mass most suggestive of a thymoma.  She saw CTS for this who ordered acetylcholine receptor antibodies which returned positive.  She is here for further evaluation and management of myasthenia gravis.    She works as a Contractor.  She lives at home.  Nonsmoker and does not drink alcohol.   Out-side paper records, electronic medical record, and images have been reviewed where available and summarized as:  Labs 12/19/2023:  AChR binding 4.56*, AChR blocking  44*  CT chest w contrast 11/02/2023:  2.2 cm soft tissue lesion along the right anterior mediastinum, raising concern for an anterior mediastinal mass such as thymoma. Consider thoracic surgery consultation for further evaluation.   Status post left mastectomy. No evidence of recurrent or metastatic disease.   Aortic Atherosclerosis (ICD10-I70.0) and Emphysema (ICD10-J43.9).   Past Medical History:  Diagnosis Date   Ductal carcinoma in situ (DCIS) of left breast 2008   s/p mastectomy, reconstruction   Fibromyalgia    Frequent headaches    HLD (hyperlipidemia)    Hypertension    Hypothyroidism     Past Surgical History:  Procedure Laterality Date   BREAST SURGERY Left    Cancer and implant   MASTECTOMY Left    REDUCTION MAMMAPLASTY Right      Medications:  Outpatient Encounter Medications as of 12/26/2023  Medication Sig   hydrochlorothiazide (HYDRODIURIL) 25 MG tablet Take 25 mg by mouth daily.   SYNTHROID 25 MCG tablet  Take 50 mcg by mouth daily before breakfast.   No facility-administered encounter medications on file as of 12/26/2023.    Allergies:  Allergies  Allergen Reactions   Sulfa Antibiotics     Rash?    Family History: Family History  Problem Relation Age of Onset    COPD Mother    Cancer Father    Breast cancer Neg Hx     Social History: Social History   Tobacco Use   Smoking status: Former    Current packs/day: 0.00    Types: Cigarettes    Quit date: 05/31/1987    Years since quitting: 36.5   Smokeless tobacco: Never  Substance Use Topics   Alcohol use: Not Currently    Comment: Drank heavily for 14 years.   Drug use: No   Social History   Social History Narrative   Are you right handed or left handed? Right Handed    Are you currently employed ? Work   What is your current occupation? [employer]    Do you live at home alone? No    Who lives with you? Husband    What type of home do you live in: 1 story or 2 story? Lives in a double wide. Stairs leading up to porch.         Vital Signs:  BP 124/82   Pulse 62   Ht 5' 6 (1.676 m)   Wt 197 lb (89.4 kg)   LMP 02/03/2015   SpO2 98%   BMI 31.80 kg/m   Neurological Exam: MENTAL STATUS including orientation to time, place, person, recent and remote memory, attention span and concentration, language, and fund of knowledge is normal.  Speech is not dysarthric.  CRANIAL NERVES: II:  No visual field defects.     III-IV-VI: Pupils equal round and reactive to light.  Normal conjugate, extra-ocular eye movements in all directions of gaze.  No nystagmus.  Mild left ptosis at baseline and worse with sustained upgaze.    V:  Normal facial sensation.    VII:  Normal facial symmetry and movements.  There is mild weakness with buccinator testing.  Orbicularis oculi and oris are intact.  VIII:  Normal hearing and vestibular function.   IX-X:  Normal palatal movement.   XI:  Normal shoulder shrug and head rotation.   XII:  Normal tongue strength and range of motion, no deviation or fasciculation.  MOTOR:  Motor strength is 5/5 throughout, including neck flexion.  No limb fatigability. No atrophy, fasciculations or abnormal movements.  No pronator drift.   MSRs:                                            Right        Left brachioradialis 2+  2+  biceps 2+  2+  triceps 2+  2+  patellar 2+  2+  ankle jerk 2+  2+  Hoffman no  no  plantar response down  down   SENSORY:  Normal and symmetric perception of light touch, pinprick, vibration, and temperature  COORDINATION/GAIT: Normal finger-to- nose-finger.  Intact rapid alternating movements bilaterally.  Able to rise from a chair without using arms.  Gait narrow based and stable. Tandem and stressed gait intact.     Thank you for allowing me to participate in patient's care.  If I can answer any additional questions, I  would be pleased to do so.    Sincerely,    Andera Cranmer K. Tobie, DO

## 2023-12-26 NOTE — Patient Instructions (Signed)
 Start mestinon  60mg  1 tablet at 7am, noon, 5pm  Please send me a MyChart message in 1 week to let me know how you are doing.

## 2023-12-29 NOTE — Telephone Encounter (Signed)
 I could do a 4 or 4:15 pm on any Monday or Thursday where my current last patient is at 3:15.  Just let me know

## 2024-01-09 ENCOUNTER — Ambulatory Visit: Admitting: Thoracic Surgery (Cardiothoracic Vascular Surgery)

## 2024-01-10 DIAGNOSIS — E039 Hypothyroidism, unspecified: Secondary | ICD-10-CM | POA: Diagnosis not present

## 2024-01-10 DIAGNOSIS — R899 Unspecified abnormal finding in specimens from other organs, systems and tissues: Secondary | ICD-10-CM | POA: Diagnosis not present

## 2024-01-10 DIAGNOSIS — E7800 Pure hypercholesterolemia, unspecified: Secondary | ICD-10-CM | POA: Diagnosis not present

## 2024-01-26 ENCOUNTER — Telehealth (HOSPITAL_COMMUNITY): Payer: Self-pay | Admitting: Radiology

## 2024-01-26 NOTE — Telephone Encounter (Signed)
 Patient called and cancelled echocardiogram. She states she will call back to reschedule.

## 2024-01-30 ENCOUNTER — Ambulatory Visit
Attending: Thoracic Surgery (Cardiothoracic Vascular Surgery) | Admitting: Thoracic Surgery (Cardiothoracic Vascular Surgery)

## 2024-01-30 VITALS — BP 149/85 | HR 57 | Resp 18 | Ht 66.0 in | Wt 206.0 lb

## 2024-01-30 DIAGNOSIS — J9859 Other diseases of mediastinum, not elsewhere classified: Secondary | ICD-10-CM

## 2024-01-30 NOTE — Progress Notes (Signed)
 PCP is [name], NP Referring Provider is Caleen Repine, [place]  Chief Complaint  Patient presents with   Follow-up   Mediastinal Mass    HPI: Chelsey Thompson returns to discuss resection of her anterior mediastinal mass.  Chelsey Thompson is a 63 year old woman with a history of hypertension, hyperlipidemia, fibromyalgia, DCIS of the left breast, left mastectomy with reconstruction, hypothyroidism, angina mediastinal mass, and recently diagnosed myasthenia gravis.  Initially presented with complaints of fatigue, swelling in her legs, and weight gain.  She had a CT for coronary calcium score which showed a 2.3 x 1.6 cm anterior mediastinal mass.  She presented with left-sided facial drooping mainly involving her left eyelid.  She was diagnosed with Bell's palsy.  Symptoms improved with steroids but she did not like the way they made her feel.  Later proven to be myasthenia gravis with positive antibodies.  She saw Dr. Tobie of neurology.  She was started on Mestinon .  Did not notice much improvement with that medication.  She refused to go back on prednisone .  Zubrod Score: At the time of surgery this patient's most appropriate activity status/level should be described as: []     0    Normal activity, no symptoms [x]     1    Restricted in physical strenuous activity but ambulatory, able to do out light work []     2    Ambulatory and capable of self care, unable to do work activities, up and about >50 % of waking hours                              []     3    Only limited self care, in bed greater than 50% of waking hours []     4    Completely disabled, no self care, confined to bed or chair []     5    Moribund  Past Medical History:  Diagnosis Date   Ductal carcinoma in situ (DCIS) of left breast 2008   s/p mastectomy, reconstruction   Fibromyalgia    Frequent headaches    HLD (hyperlipidemia)    Hypertension    Hypothyroidism     Past Surgical History:  Procedure Laterality Date    BREAST SURGERY Left    Cancer and implant   MASTECTOMY Left    REDUCTION MAMMAPLASTY Right     Family History  Problem Relation Age of Onset   COPD Mother    Cancer Father    Breast cancer Neg Hx     Social History Social History   Tobacco Use   Smoking status: Former    Current packs/day: 0.00    Types: Cigarettes    Quit date: 05/31/1987    Years since quitting: 36.6   Smokeless tobacco: Never  Substance Use Topics   Alcohol use: Not Currently    Comment: Drank heavily for 14 years.   Drug use: No    Current Outpatient Medications  Medication Sig Dispense Refill   hydrochlorothiazide (HYDRODIURIL) 25 MG tablet Take 25 mg by mouth daily.     pyridostigmine  (MESTINON ) 60 MG tablet Take 1 tablet at 7a, noon, and 5pm. 90 tablet 5   SYNTHROID 25 MCG tablet Take 50 mcg by mouth daily before breakfast.     No current facility-administered medications for this visit.    Allergies  Allergen Reactions   Sulfa Antibiotics     Rash?    Review of Systems  Constitutional:  Positive for activity change, fatigue and unexpected weight change.  HENT:  Positive for trouble swallowing. Negative for voice change.   Eyes:  Positive for visual disturbance (left eye ptosis).  Cardiovascular:  Positive for leg swelling.  Neurological:  Positive for weakness.    BP (!) 149/85   Pulse (!) 57   Resp 18   Ht 5' 6 (1.676 m)   Wt 206 lb (93.4 kg)   LMP 02/03/2015   SpO2 94%   BMI 33.25 kg/m  Physical Exam Vitals reviewed.  Constitutional:      Appearance: Normal appearance.  Eyes:     General: No scleral icterus.    Pupils: Pupils are equal, round, and reactive to light.     Comments: Mild left ptosis  Cardiovascular:     Rate and Rhythm: Normal rate and regular rhythm.     Heart sounds: Normal heart sounds. No murmur heard. Pulmonary:     Effort: Pulmonary effort is normal. No respiratory distress.     Breath sounds: Normal breath sounds. No wheezing.  Abdominal:      General: There is no distension.     Palpations: Abdomen is soft.     Tenderness: There is no abdominal tenderness.  Musculoskeletal:     Cervical back: Neck supple.  Lymphadenopathy:     Cervical: No cervical adenopathy.  Skin:    General: Skin is warm and dry.  Neurological:     General: No focal deficit present.     Mental Status: She is alert and oriented to person, place, and time.     Cranial Nerves: No cranial nerve deficit.     Motor: No weakness.     Diagnostic Tests: CT CHEST WITH CONTRAST   TECHNIQUE: Multidetector CT imaging of the chest was performed during intravenous contrast administration.   RADIATION DOSE REDUCTION: This exam was performed according to the departmental dose-optimization program which includes automated exposure control, adjustment of the mA and/or kV according to patient size and/or use of iterative reconstruction technique.   CONTRAST:  100mL ISOVUE -300 IOPAMIDOL  (ISOVUE -300) INJECTION 61%   COMPARISON:  Partial comparison to cardiac CT dated 09/27/2023   FINDINGS: Cardiovascular: Heart is normal in size.  No pericardial effusion.   No evidence of thoracic aortic aneurysm. Mild atherosclerotic calcifications of the aortic arch.   Mild three-vessel coronary atherosclerosis.   Mediastinum/Nodes: No suspicious mediastinal lymphadenopathy.   1.7 x 2.2 cm soft tissue lesion along the right anterior mediastinum with peripheral calcification (image 58), raising concern for an anterior mediastinal mass such as thymoma.   Visualized thyroid  is unremarkable.   Lungs/Pleura: No suspicious pulmonary nodules.   Mild scarring in the bilateral lower lobes and medial right middle lobe.   No focal consolidation.   Mild centrilobular emphysematous changes, upper lung predominant.   Upper Abdomen: Visualized upper abdomen is grossly unremarkable.   Musculoskeletal: Status post left mastectomy with reconstruction. Mild midthoracic  dextroscoliosis.   IMPRESSION: 2.2 cm soft tissue lesion along the right anterior mediastinum, raising concern for an anterior mediastinal mass such as thymoma. Consider thoracic surgery consultation for further evaluation.   Status post left mastectomy. No evidence of recurrent or metastatic disease.   Aortic Atherosclerosis (ICD10-I70.0) and Emphysema (ICD10-J43.9).     Electronically Signed   By: Pinkie Pebbles M.D.   On: 11/02/2023 23:02 I personally reviewed the CT images.  There is a 1.7 x 2.2 cm soft tissue mass in the anterior mediastinum with peripheral calcification.  Postmastectomy.  Aortic atherosclerosis.  Acetylcholine blocking antibodies 44  Acetylcholine receptor binding antibodies 4.56  Impression: Chelsey Thompson is a 63 year old woman with a history of hypertension, hyperlipidemia, fibromyalgia, DCIS of the left breast, left mastectomy with reconstruction, hypothyroidism, anterior mediastinal mass, and recently diagnosed myasthenia gravis.  Anterior mediastinal mass.  Almost certainly a thymoma given her history.  Needs to be resected.  Germ cell tumors, ectopic thyroid , and lymphoma are also in the differential.  Relatively small tumor amenable to minimally invasive resection.  I recommended that we proceed with a robotic assisted right VATS for thymectomy.  I discussed the general nature of the procedure, including the need for general anesthesia, the incisions to be used, the use of the surgical robot, and drainage tubes postoperatively with Chelsey Thompson.  We discussed the expected hospital stay, overall recovery and short and long term outcomes. I informed her of the indications, risks, benefits and alternatives.   She understands the risks include, but are not limited to death, stroke, MI, DVT/PE, bleeding, possible need for transfusion, infections, cardiac arrhythmias, as well as other organ system dysfunction including respiratory, renal, or GI complications.   She understands the possible need for conversion to an open procedure.  She does have myasthenia.  She is on Mestinon  but says she has not noticed much of a difference with the medication.  I informed her that she may need steroids around the time of surgery.  Also plan to consult anesthesiology preoperatively so that they can plan their anesthetic accordingly.   Plan:  Will schedule robotic assisted right VATS for thymectomy the first week of October. Will arrange anesthesiology consultation prior to surgery.  [name], MD [facility] [phone]

## 2024-01-31 ENCOUNTER — Encounter: Payer: Self-pay | Admitting: *Deleted

## 2024-01-31 ENCOUNTER — Other Ambulatory Visit: Payer: Self-pay | Admitting: *Deleted

## 2024-01-31 ENCOUNTER — Ambulatory Visit (HOSPITAL_COMMUNITY)

## 2024-01-31 DIAGNOSIS — J9859 Other diseases of mediastinum, not elsewhere classified: Secondary | ICD-10-CM

## 2024-02-12 ENCOUNTER — Ambulatory Visit: Admitting: Pharmacist Clinician (PhC)/ Clinical Pharmacy Specialist

## 2024-02-20 ENCOUNTER — Ambulatory Visit: Admitting: Neurology

## 2024-02-23 NOTE — Pre-Procedure Instructions (Signed)
 Surgical Instructions   Your procedure is scheduled on March 01, 2024. Report to [facility] Main Entrance A at   A.M., then check in with the Admitting office. Any questions or running late day of surgery: call [phone]  Questions prior to your surgery date: call [phone], Monday-Friday, 8am-4pm. If you experience any cold or flu symptoms such as cough, fever, chills, shortness of breath, etc. between now and your scheduled surgery, please notify us  at the above number.     Remember:  Do not eat or drink after midnight the night before your surgery    Take these medicines the morning of surgery with A SIP OF WATER: pyridostigmine  (MESTINON )  SYNTHROID    One week prior to surgery, STOP taking any Aspirin (unless otherwise instructed by your surgeon) Aleve, Naproxen, Ibuprofen, Motrin, Advil, Goody's, BC's, all herbal medications, fish oil, and non-prescription vitamins.                     Do NOT Smoke (Tobacco/Vaping) for 24 hours prior to your procedure.  If you use a CPAP at night, you may bring your mask/headgear for your overnight stay.   You will be asked to remove any contacts, glasses, piercing's, hearing aid's, dentures/partials prior to surgery. Please bring cases for these items if needed.    Patients discharged the day of surgery will not be allowed to drive home, and someone needs to stay with them for 24 hours.  SURGICAL WAITING ROOM VISITATION Patients may have no more than 2 support people in the waiting area - these visitors may rotate.   Pre-op nurse will coordinate an appropriate time for 1 ADULT support person, who may not rotate, to accompany patient in pre-op.  Children under the age of 54 must have an adult with them who is not the patient and must remain in the main waiting area with an adult.  If the patient needs to stay at the hospital during part of their recovery, the visitor guidelines for inpatient rooms apply.  Please refer to the [facility] website for the visitor guidelines for any additional information.   If you received a COVID test during your pre-op visit  it is requested that you wear a mask when out in public, stay away from anyone that may not be feeling well and notify your surgeon if you develop symptoms. If you have been in contact with anyone that has tested positive in the last 10 days please notify you surgeon.      Pre-operative CHG Bathing Instructions   You can play a key role in reducing the risk of infection after surgery. Your skin needs to be as free of germs as possible. You can reduce the number of germs on your skin by washing with CHG (chlorhexidine gluconate) soap before surgery. CHG is an antiseptic soap that kills germs and continues to kill germs even after washing.   DO NOT use if you have an allergy to chlorhexidine/CHG or antibacterial soaps. If your skin becomes reddened or irritated, stop using the CHG and notify one of our RNs at [phone].              TAKE A SHOWER THE NIGHT BEFORE SURGERY AND THE DAY OF SURGERY    Please keep in mind the following:  DO NOT shave, including legs and underarms, 48 hours prior to surgery.   You may shave your face before/day of surgery.  Place clean sheets on your bed the night  before surgery Use a clean washcloth (not used since being washed) for each shower. DO NOT sleep with pet's night before surgery.  CHG Shower Instructions:  Wash your face and private area with normal soap. If you choose to wash your hair, wash first with your normal shampoo.  After you use shampoo/soap, rinse your hair and body thoroughly to remove shampoo/soap residue.  Turn the water OFF and apply half the bottle of CHG soap to a CLEAN washcloth.  Apply CHG soap ONLY FROM YOUR NECK DOWN TO YOUR TOES (washing for 3-5 minutes)  DO NOT use CHG soap on face, private areas, open wounds, or sores.  Pay special attention to the area where your surgery is being performed.  If  you are having back surgery, having someone wash your back for you may be helpful. Wait 2 minutes after CHG soap is applied, then you may rinse off the CHG soap.  Pat dry with a clean towel  Put on clean pajamas    Additional instructions for the day of surgery: DO NOT APPLY any lotions, deodorants, cologne, or perfumes.   Do not wear jewelry or makeup Do not wear nail polish, gel polish, artificial nails, or any other type of covering on natural nails (fingers and toes) Do not bring valuables to the hospital. [facility] is not responsible for valuables/personal belongings. Put on clean/comfortable clothes.  Please brush your teeth.  Ask your nurse before applying any prescription medications to the skin.

## 2024-02-26 ENCOUNTER — Other Ambulatory Visit: Payer: Self-pay

## 2024-02-26 ENCOUNTER — Inpatient Hospital Stay (HOSPITAL_COMMUNITY)
Admission: RE | Admit: 2024-02-26 | Discharge: 2024-02-26 | Disposition: A | Discharge status: Home / Self Care | Source: Ambulatory Visit

## 2024-02-26 ENCOUNTER — Encounter (HOSPITAL_COMMUNITY)
Admission: RE | Admit: 2024-02-26 | Discharge: 2024-02-26 | Disposition: A | Discharge status: Home / Self Care | Source: Ambulatory Visit | Attending: Thoracic Surgery (Cardiothoracic Vascular Surgery) | Admitting: Thoracic Surgery (Cardiothoracic Vascular Surgery)

## 2024-02-26 ENCOUNTER — Encounter (HOSPITAL_COMMUNITY): Payer: Self-pay

## 2024-02-26 VITALS — BP 151/84 | HR 58 | Temp 98.2°F | Resp 16 | Ht 66.0 in | Wt 204.0 lb

## 2024-02-26 DIAGNOSIS — M797 Fibromyalgia: Secondary | ICD-10-CM | POA: Diagnosis not present

## 2024-02-26 DIAGNOSIS — I10 Essential (primary) hypertension: Secondary | ICD-10-CM | POA: Diagnosis not present

## 2024-02-26 DIAGNOSIS — J9859 Other diseases of mediastinum, not elsewhere classified: Secondary | ICD-10-CM | POA: Diagnosis not present

## 2024-02-26 DIAGNOSIS — G7000 Myasthenia gravis without (acute) exacerbation: Secondary | ICD-10-CM | POA: Diagnosis not present

## 2024-02-26 DIAGNOSIS — E785 Hyperlipidemia, unspecified: Secondary | ICD-10-CM | POA: Insufficient documentation

## 2024-02-26 DIAGNOSIS — I6521 Occlusion and stenosis of right carotid artery: Secondary | ICD-10-CM | POA: Diagnosis not present

## 2024-02-26 DIAGNOSIS — Z87891 Personal history of nicotine dependence: Secondary | ICD-10-CM | POA: Insufficient documentation

## 2024-02-26 DIAGNOSIS — E039 Hypothyroidism, unspecified: Secondary | ICD-10-CM | POA: Diagnosis not present

## 2024-02-26 DIAGNOSIS — Z01818 Encounter for other preprocedural examination: Secondary | ICD-10-CM | POA: Insufficient documentation

## 2024-02-26 DIAGNOSIS — D4989 Neoplasm of unspecified behavior of other specified sites: Secondary | ICD-10-CM | POA: Insufficient documentation

## 2024-02-26 HISTORY — DX: Nausea with vomiting, unspecified: R11.2

## 2024-02-26 HISTORY — DX: Myasthenia gravis without (acute) exacerbation: G70.00

## 2024-02-26 LAB — CBC
HCT: 41 % (ref 36.0–46.0)
Hemoglobin: 14.1 g/dL (ref 12.0–15.0)
MCH: 31.6 pg (ref 26.0–34.0)
MCHC: 34.4 g/dL (ref 30.0–36.0)
MCV: 91.9 fL (ref 80.0–100.0)
Platelets: 214 K/uL (ref 150–400)
RBC: 4.46 MIL/uL (ref 3.87–5.11)
RDW: 12.6 % (ref 11.5–15.5)
WBC: 5.8 K/uL (ref 4.0–10.5)
nRBC: 0 % (ref 0.0–0.2)

## 2024-02-26 LAB — URINALYSIS, ROUTINE W REFLEX MICROSCOPIC
Bilirubin Urine: NEGATIVE
Glucose, UA: NEGATIVE mg/dL
Hgb urine dipstick: NEGATIVE
Ketones, ur: NEGATIVE mg/dL
Leukocytes,Ua: NEGATIVE
Nitrite: NEGATIVE
Protein, ur: NEGATIVE mg/dL
Specific Gravity, Urine: 1.019 (ref 1.005–1.030)
pH: 6 (ref 5.0–8.0)

## 2024-02-26 LAB — TYPE AND SCREEN
ABO/RH(D): O POS
Antibody Screen: NEGATIVE

## 2024-02-26 LAB — PROTIME-INR
INR: 1 (ref 0.8–1.2)
Prothrombin Time: 13.2 s (ref 11.4–15.2)

## 2024-02-26 LAB — SURGICAL PCR SCREEN
MRSA, PCR: NEGATIVE
Staphylococcus aureus: NEGATIVE

## 2024-02-26 LAB — COMPREHENSIVE METABOLIC PANEL WITH GFR
ALT: 12 U/L (ref 0–44)
AST: 19 U/L (ref 15–41)
Albumin: 4.1 g/dL (ref 3.5–5.0)
Alkaline Phosphatase: 51 U/L (ref 38–126)
Anion gap: 10 (ref 5–15)
BUN: 7 mg/dL — ABNORMAL LOW (ref 8–23)
CO2: 27 mmol/L (ref 22–32)
Calcium: 9.2 mg/dL (ref 8.9–10.3)
Chloride: 94 mmol/L — ABNORMAL LOW (ref 98–111)
Creatinine, Ser: 0.64 mg/dL (ref 0.44–1.00)
GFR, Estimated: >60 mL/min (ref >60)
Glucose, Bld: 90 mg/dL (ref 70–99)
Potassium: 3.2 mmol/L — ABNORMAL LOW (ref 3.5–5.1)
Sodium: 131 mmol/L — ABNORMAL LOW (ref 135–145)
Total Bilirubin: 1.2 mg/dL (ref 0.0–1.2)
Total Protein: 6.8 g/dL (ref 6.5–8.1)

## 2024-02-26 LAB — APTT: aPTT: 34 s (ref 24–36)

## 2024-02-26 NOTE — Pre-Procedure Instructions (Signed)
 Surgical Instructions   Your procedure is scheduled on March 01, 2024. Report to [facility] Main Entrance A at  8:55 A.M., then check in with the Admitting office. Any questions or running late day of surgery: call [phone]  Questions prior to your surgery date: call [phone], Monday-Friday, 8am-4pm. If you experience any cold or flu symptoms such as cough, fever, chills, shortness of breath, etc. between now and your scheduled surgery, please notify us  at the above number.     Remember:  Do not eat or drink after midnight the night before your surgery. No gum, mints, or hard candy.    Take these medicines the morning of surgery with A SIP OF WATER: pyridostigmine  (MESTINON )  SYNTHROID    One week prior to surgery, STOP taking any Aspirin (unless otherwise instructed by your surgeon) Aleve, Naproxen, Ibuprofen, Motrin, Advil, Goody's, BC's, all herbal medications, fish oil, and non-prescription vitamins.                     Do NOT Smoke (Tobacco/Vaping) for 24 hours prior to your procedure.  If you use a CPAP at night, you may bring your mask/headgear for your overnight stay.   You will be asked to remove any contacts, glasses, piercing's, hearing aid's, dentures/partials prior to surgery. Please bring cases for these items if needed.    Patients discharged the day of surgery will not be allowed to drive home, and someone needs to stay with them for 24 hours.  SURGICAL WAITING ROOM VISITATION Patients may have no more than 2 support people in the waiting area - these visitors may rotate.   Pre-op nurse will coordinate an appropriate time for 1 ADULT support person, who may not rotate, to accompany patient in pre-op.  Children under the age of 47 must have an adult with them who is not the patient and must remain in the main waiting area with an adult.  If the patient needs to stay at the hospital during part of their recovery, the visitor guidelines for inpatient rooms  apply.  Please refer to the [facility] website for the visitor guidelines for any additional information.   If you received a COVID test during your pre-op visit  it is requested that you wear a mask when out in public, stay away from anyone that may not be feeling well and notify your surgeon if you develop symptoms. If you have been in contact with anyone that has tested positive in the last 10 days please notify you surgeon.      Pre-operative CHG Bathing Instructions   You can play a key role in reducing the risk of infection after surgery. Your skin needs to be as free of germs as possible. You can reduce the number of germs on your skin by washing with CHG (chlorhexidine gluconate) soap before surgery. CHG is an antiseptic soap that kills germs and continues to kill germs even after washing.   DO NOT use if you have an allergy to chlorhexidine/CHG or antibacterial soaps. If your skin becomes reddened or irritated, stop using the CHG and notify one of our RNs at [phone].              TAKE A SHOWER THE NIGHT BEFORE SURGERY AND THE DAY OF SURGERY    Please keep in mind the following:  DO NOT shave, including legs and underarms, 48 hours prior to surgery.   You may shave your face before/day of surgery.  Place clean  sheets on your bed the night before surgery Use a clean washcloth (not used since being washed) for each shower. DO NOT sleep with pet's night before surgery.  CHG Shower Instructions:  Wash your face and private area with normal soap. If you choose to wash your hair, wash first with your normal shampoo.  After you use shampoo/soap, rinse your hair and body thoroughly to remove shampoo/soap residue.  Turn the water OFF and apply half the bottle of CHG soap to a CLEAN washcloth.  Apply CHG soap ONLY FROM YOUR NECK DOWN TO YOUR TOES (washing for 3-5 minutes)  DO NOT use CHG soap on face, private areas, open wounds, or sores.  Pay special attention to the area where  your surgery is being performed.  If you are having back surgery, having someone wash your back for you may be helpful. Wait 2 minutes after CHG soap is applied, then you may rinse off the CHG soap.  Pat dry with a clean towel  Put on clean pajamas    Additional instructions for the day of surgery: DO NOT APPLY any lotions, deodorants, cologne, or perfumes.   Do not wear jewelry or makeup Do not wear nail polish, gel polish, artificial nails, or any other type of covering on natural nails (fingers and toes) Do not bring valuables to the hospital. [facility] is not responsible for valuables/personal belongings. Put on clean/comfortable clothes.  Please brush your teeth.  Ask your nurse before applying any prescription medications to the skin.

## 2024-02-26 NOTE — Progress Notes (Signed)
 PCP - [name], NP Cardiologist -Court Dorn PARAS, MD Neurology- Tonita Blanch, DO  PPM/ICD - denies    Chest x-ray - DOS- order for within 72 hours of surgery EKG - 02/26/2024  Stress Test - denies ECHO - denies- pt has ECHO ordered by Dr Court- per pt report, she is going to have this done after surgery.  Cardiac Cath - denies  Sleep Study - denies    Fasting Blood Sugar - N/A  Last dose of GLP1 agonist-  N/A   Blood Thinner Instructions: N/A Aspirin Instructions:N/A  ERAS Protcol - NPO order   COVID TEST- N/A   Anesthesia review: yes- Isaiah Ruder, PA spoke with pt at PAT. Pt with recent dx of Myasthenia Gravis  Patient denies shortness of breath, fever, cough and chest pain at PAT appointment   All instructions explained to the patient, with a verbal understanding of the material. Patient agrees to go over the instructions while at home for a better understanding.  The opportunity to ask questions was provided.

## 2024-02-26 NOTE — Progress Notes (Signed)
 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   On 09/27/2023, she had a CT coronary calcium score that was elevated at 145 (92nd percentile).  On CT over read a 2.3 x 1.6 cm soft tissue density in anterior mediastinum was incidentally noted with follow-up CTA or MRI recommended. On 11/01/2023, she had a CT chest confirming 2.2 cm right anterior mediastinal lesion raising concern for mass such as thymoma. CT surgery consultation recommended. In the interim, she was seen at [facility] on 11/25/2023 for left facial weakness and left eye ptosis. Head CT without acute abnormality. CTA head and neck showed focal high grade stenosis or proximal right superior terminal branch (M2), mild narrowing of bilateral M1 MCAs, ulcerated plaque at the proximal right ICA without hemodynamically significant stenosis. Symptoms attributed to Bell's Palsy and was discharged on acyclovir and prednisone . Symptoms did not improve on steroids. She was subsequently evaluated by Dr. Kerrin on 12/19/2023 and acetylcholine receptor binding, blocking, and modulating antibodies ordered to evaluate for myasthenia gravis. Results were positive. Above surgery recommended. Neurologist Dr. Tobie started her on mestinon  60 mg TID. She declined prednisone  due to potential side effects.   On 12/25/2023, she also had a cardiology evaluation by Dr. Dorn Lesches. She self referred due to family  history of CAD (father with MI age 18, survived) and concerns of LE edema with about 40 lb weight gain over the previous 10 months. Some SOB. Atypical, none exertional chest pains that had been stable for at least 10 years.  She was able to walk 8-10K steps per day. EKG showed NSR, non-specific ST abnormality. No murmur or edema on exam. HTN controlled. He noted her May 2025 CAC results as well as on-going work-up for possible myasthenia gravis and mediastinal mass.  She declined statin therapy, so he referred her to PharmD to discuss nonstatin alternatives for HLD. Echo ordered, which has not been scheduled yet. He also referred her to the [facility] for weight loss. Six month follow-up planned.   I evaluated patient at 02/26/2024 PAT visit due to surgeon request re: myasthenia gravis. She told me she is only taking mestinon  BID (0700, 1600) due to difficultly with TID regimen with her schedule. 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 She said her primary symptoms of MG has been generalized fatigue and ptosis. She has not noticed much improvement since staring mestinon . Occasionally she will have some diplopia if does prolonged work on the computer. Around June, she noticed a few episodes of swallowing difficulties, but has not had any episodes for about two months. She feels like she has to clear her throat more often and has an occasional cough, but denied associated sick symptoms like fever, sore throat. She does not note any respiratory fatigue or SOB. She denied any issues with walking or needing assistive device. She is not exercising much since school resumed, but during the summer was walking 30 minutes daily, including with inclines, and did not notice any  chest pain or significant SOB. Her weight gain from the past year has stabilized and she has not noticed any recent significant edema. She denied orthopnea, syncope.    We discussed some anesthesia considerations with  individuals with myasthenia gravis.  She has not had the echo ordered by Dr. Court in July, but does not report any current chest pain or SOB and no signs of HF on exam. Lungs clear, heart RRR, no murmur, no significant ankle edema, no carotid bruits noted. During the summer she was tolerating at least 4 METS activity. Dr. Kerrin classified her Zubrod score as 1: Restricted in physical strenuous activity but ambulatory, able to do out light work.  Reviewed above with anesthesiologist Lucious Ned, MD. Anesthesia team to evaluate on the day of surgery.    VS: BP (!) 151/84   Pulse (!) 58   Temp 36.8 C   Resp 16   Ht 5' 6 (1.676 m)   Wt 92.5 kg   LMP 02/03/2015   SpO2 99%   BMI 32.93 kg/m    PROVIDERS: [name], NP is PCP at [facility]) Court Carrier, MD is cardiologist  Tobie Breslow, DO is neurologist   LABS: Labs reviewed: Acceptable for surgery. (all labs ordered are listed, but only abnormal results are displayed)  Labs Reviewed  COMPREHENSIVE METABOLIC PANEL WITH GFR - Abnormal; Notable for the following components:      Result Value   Sodium 131 (*)    Potassium 3.2 (*)    Chloride 94 (*)    BUN 7 (*)    All other components within normal limits  SURGICAL PCR SCREEN  CBC  PROTIME-INR  APTT  URINALYSIS, ROUTINE W REFLEX MICROSCOPIC  TYPE AND SCREEN    Latest Reference Range & Units 12/19/23 10:06  Acetylchol Block Ab 0 - 25 % 44 (H)  AChR Binding Ab, Serum 0.00 - 0.24 nmol/L 4.56 (H)  (H): Data is abnormally high  TSH 0.922, Free T4 1.12, T3 112, thyroid  peroxidase Ab 16 earlier this year [facility]).   Cortisol 16.2 (6.2-19.4) on 01/10/2024 ([facility])   IMAGES: CTA Head/Neck 11/25/2023 [facility]): IMPRESSION: 1.    Focal high-grade stenosis of a proximal right superior terminal branch (M2).  2.    Mild irregularity and narrowing of bilateral M1 MCAs.  3.    Atherosclerotic disease at the carotid  bifurcation/proximal right ICA without hemodynamically significant stenosis. Ulcerated plaque at the proximal right ICA.   CT Head 11/25/2023 [facility]): IMPRESSION: No acute intracranial abnormality.   CT Chest 11/01/2023: IMPRESSION: - 2.2 cm soft tissue lesion along the right anterior mediastinum, raising concern for an anterior mediastinal mass such as thymoma. Consider thoracic surgery consultation for further evaluation. - Status post left mastectomy. No evidence of recurrent or metastatic disease. - Aortic Atherosclerosis (ICD10-I70.0) and Emphysema (ICD10-J43.9).    EKG:  EKG 02/26/2024: Sinus bradycardia at 54 bpm Nonspecific ST abnormality Abnormal ECG Confirmed by Darliss Rogue ([id number]) on 02/26/2024 4:30:49 PM   CV: CT Cardiac Calcium Scoring 09/27/2023: FINDINGS: Coronary arteries: Normal origins. Coronary Calcium Score: Left main: 0 Left anterior descending artery: 36 Left circumflex artery: 2 Right coronary artery: 106  Total: 145 Percentile: 92  Pericardium: Normal. Ascending Aorta: Normal caliber.   Non-cardiac: See separate report from [facility]. Soft tissue density in anterior mediastinum, would follow-up radiology over-read and consider CTA chest or MRI for further evaluation   IMPRESSION: Coronary calcium score of 145. This was 92nd percentile for  age-, race-, and sex-matched controls.   Soft tissue density in anterior mediastinum, would follow-up radiology over-read and consider CTA chest or MRI for further evaluation   RECOMMENDATIONS:.. If CAC is >=100 or >=75th percentile, it is reasonable to initiate statin therapy at any age.   Cardiology referral should be considered for patients with CAC scores >=400 or >=75th percentile...   Past Medical History:  Diagnosis Date   Ductal carcinoma in situ (DCIS) of left breast 2008   s/p mastectomy, reconstruction   Fibromyalgia    Frequent headaches    HLD (hyperlipidemia)     Hypertension    Hypothyroidism    Myasthenia gravis (HCC)    PONV (postoperative nausea and vomiting)    nausea after breast reconstrucion surgery on the way home in car    Past Surgical History:  Procedure Laterality Date   BREAST SURGERY Left    Cancer and implant   MASTECTOMY Left    2008   REDUCTION MAMMAPLASTY Right     MEDICATIONS:  hydrochlorothiazide (HYDRODIURIL) 25 MG tablet   pyridostigmine  (MESTINON ) 60 MG tablet   SYNTHROID 50 MCG tablet   No current facility-administered medications for this encounter.    Isaiah Ruder, PA-C Surgical Short Stay/Anesthesiology [facility] Phone [phone] [facility] Phone [phone] 02/27/2024 10:02 AM

## 2024-03-01 ENCOUNTER — Inpatient Hospital Stay (HOSPITAL_COMMUNITY)

## 2024-03-01 ENCOUNTER — Encounter (HOSPITAL_COMMUNITY)
Admission: RE | Disposition: A | Discharge status: Home / Self Care | Payer: Self-pay | Source: Home / Self Care | Attending: Thoracic Surgery (Cardiothoracic Vascular Surgery)

## 2024-03-01 ENCOUNTER — Other Ambulatory Visit: Payer: Self-pay

## 2024-03-01 ENCOUNTER — Inpatient Hospital Stay (HOSPITAL_COMMUNITY)
Admission: RE | Admit: 2024-03-01 | Discharge: 2024-03-03 | DRG: 827 | Disposition: A | Discharge status: Home / Self Care | Attending: Thoracic Surgery (Cardiothoracic Vascular Surgery) | Admitting: Thoracic Surgery (Cardiothoracic Vascular Surgery)

## 2024-03-01 ENCOUNTER — Inpatient Hospital Stay (HOSPITAL_COMMUNITY): Payer: Self-pay | Admitting: Certified Registered"

## 2024-03-01 ENCOUNTER — Inpatient Hospital Stay (HOSPITAL_COMMUNITY): Payer: Self-pay | Admitting: Vascular Surgery

## 2024-03-01 ENCOUNTER — Encounter (HOSPITAL_COMMUNITY): Payer: Self-pay | Admitting: Thoracic Surgery (Cardiothoracic Vascular Surgery)

## 2024-03-01 DIAGNOSIS — D4989 Neoplasm of unspecified behavior of other specified sites: Secondary | ICD-10-CM | POA: Diagnosis not present

## 2024-03-01 DIAGNOSIS — R0989 Other specified symptoms and signs involving the circulatory and respiratory systems: Secondary | ICD-10-CM | POA: Diagnosis not present

## 2024-03-01 DIAGNOSIS — Z79890 Hormone replacement therapy (postmenopausal): Secondary | ICD-10-CM | POA: Diagnosis not present

## 2024-03-01 DIAGNOSIS — D630 Anemia in neoplastic disease: Secondary | ICD-10-CM | POA: Diagnosis not present

## 2024-03-01 DIAGNOSIS — Z825 Family history of asthma and other chronic lower respiratory diseases: Secondary | ICD-10-CM

## 2024-03-01 DIAGNOSIS — Z87891 Personal history of nicotine dependence: Secondary | ICD-10-CM | POA: Diagnosis not present

## 2024-03-01 DIAGNOSIS — E039 Hypothyroidism, unspecified: Secondary | ICD-10-CM | POA: Diagnosis not present

## 2024-03-01 DIAGNOSIS — J90 Pleural effusion, not elsewhere classified: Secondary | ICD-10-CM | POA: Diagnosis not present

## 2024-03-01 DIAGNOSIS — C37 Malignant neoplasm of thymus: Secondary | ICD-10-CM | POA: Diagnosis not present

## 2024-03-01 DIAGNOSIS — Z01818 Encounter for other preprocedural examination: Secondary | ICD-10-CM

## 2024-03-01 DIAGNOSIS — Z86000 Personal history of in-situ neoplasm of breast: Secondary | ICD-10-CM | POA: Diagnosis not present

## 2024-03-01 DIAGNOSIS — Z9012 Acquired absence of left breast and nipple: Secondary | ICD-10-CM | POA: Diagnosis not present

## 2024-03-01 DIAGNOSIS — Z79899 Other long term (current) drug therapy: Secondary | ICD-10-CM

## 2024-03-01 DIAGNOSIS — G7000 Myasthenia gravis without (acute) exacerbation: Secondary | ICD-10-CM | POA: Diagnosis not present

## 2024-03-01 DIAGNOSIS — J9811 Atelectasis: Secondary | ICD-10-CM | POA: Diagnosis not present

## 2024-03-01 DIAGNOSIS — E876 Hypokalemia: Secondary | ICD-10-CM | POA: Diagnosis not present

## 2024-03-01 DIAGNOSIS — M797 Fibromyalgia: Secondary | ICD-10-CM | POA: Diagnosis not present

## 2024-03-01 DIAGNOSIS — I10 Essential (primary) hypertension: Secondary | ICD-10-CM | POA: Diagnosis present

## 2024-03-01 DIAGNOSIS — E785 Hyperlipidemia, unspecified: Secondary | ICD-10-CM | POA: Diagnosis not present

## 2024-03-01 DIAGNOSIS — G510 Bell's palsy: Secondary | ICD-10-CM | POA: Diagnosis present

## 2024-03-01 DIAGNOSIS — E320 Persistent hyperplasia of thymus: Secondary | ICD-10-CM | POA: Diagnosis not present

## 2024-03-01 DIAGNOSIS — Z4682 Encounter for fitting and adjustment of non-vascular catheter: Secondary | ICD-10-CM | POA: Diagnosis not present

## 2024-03-01 DIAGNOSIS — Z9889 Other specified postprocedural states: Principal | ICD-10-CM

## 2024-03-01 DIAGNOSIS — R918 Other nonspecific abnormal finding of lung field: Secondary | ICD-10-CM | POA: Diagnosis not present

## 2024-03-01 DIAGNOSIS — J9859 Other diseases of mediastinum, not elsewhere classified: Secondary | ICD-10-CM | POA: Diagnosis not present

## 2024-03-01 DIAGNOSIS — I2510 Atherosclerotic heart disease of native coronary artery without angina pectoris: Secondary | ICD-10-CM | POA: Diagnosis not present

## 2024-03-01 HISTORY — PX: THYMECTOMY, ROBOT-ASSISTED: SHX7646

## 2024-03-01 LAB — POCT I-STAT, CHEM 8
BUN: 15 mg/dL (ref 8–23)
Calcium, Ion: 1.11 mmol/L — ABNORMAL LOW (ref 1.15–1.40)
Chloride: 105 mmol/L (ref 98–111)
Creatinine, Ser: 0.8 mg/dL (ref 0.44–1.00)
Glucose, Bld: 86 mg/dL (ref 70–99)
HCT: 40 % (ref 36.0–46.0)
Hemoglobin: 13.6 g/dL (ref 12.0–15.0)
Potassium: 3.5 mmol/L (ref 3.5–5.1)
Sodium: 137 mmol/L (ref 135–145)
TCO2: 26 mmol/L (ref 22–32)

## 2024-03-01 LAB — POCT I-STAT 7, (LYTES, BLD GAS, ICA,H+H)
Acid-Base Excess: 0 mmol/L (ref 0.0–2.0)
Acid-base deficit: 1 mmol/L (ref 0.0–2.0)
Acid-base deficit: 2 mmol/L (ref 0.0–2.0)
Bicarbonate: 25.9 mmol/L (ref 20.0–28.0)
Bicarbonate: 26.3 mmol/L (ref 20.0–28.0)
Bicarbonate: 25.6 mmol/L (ref 20.0–28.0)
Calcium, Ion: 1.23 mmol/L (ref 1.15–1.40)
Calcium, Ion: 1.25 mmol/L (ref 1.15–1.40)
Calcium, Ion: 1.19 mmol/L (ref 1.15–1.40)
HCT: 36 % (ref 36.0–46.0)
HCT: 37 % (ref 36.0–46.0)
HCT: 36 % (ref 36.0–46.0)
Hemoglobin: 12.2 g/dL (ref 12.0–15.0)
Hemoglobin: 12.6 g/dL (ref 12.0–15.0)
Hemoglobin: 12.2 g/dL (ref 12.0–15.0)
O2 Saturation: 95 %
O2 Saturation: 93 %
O2 Saturation: 100 %
Patient temperature: 35.6
Potassium: 3.1 mmol/L — ABNORMAL LOW (ref 3.5–5.1)
Potassium: 3.2 mmol/L — ABNORMAL LOW (ref 3.5–5.1)
Potassium: 3.1 mmol/L — ABNORMAL LOW (ref 3.5–5.1)
Sodium: 136 mmol/L (ref 135–145)
Sodium: 136 mmol/L (ref 135–145)
Sodium: 137 mmol/L (ref 135–145)
TCO2: 27 mmol/L (ref 22–32)
TCO2: 28 mmol/L (ref 22–32)
TCO2: 27 mmol/L (ref 22–32)
pCO2 arterial: 48.1 mmHg — ABNORMAL HIGH (ref 32–48)
pCO2 arterial: 56.8 mmHg — ABNORMAL HIGH (ref 32–48)
pCO2 arterial: 46 mmHg (ref 32–48)
pH, Arterial: 7.332 — ABNORMAL LOW (ref 7.35–7.45)
pH, Arterial: 7.272 — ABNORMAL LOW (ref 7.35–7.45)
pH, Arterial: 7.354 (ref 7.35–7.45)
pO2, Arterial: 74 mmHg — ABNORMAL LOW (ref 83–108)
pO2, Arterial: 78 mmHg — ABNORMAL LOW (ref 83–108)
pO2, Arterial: 242 mmHg — ABNORMAL HIGH (ref 83–108)

## 2024-03-01 LAB — ABO/RH: ABO/RH(D): O POS

## 2024-03-01 SURGERY — THYMECTOMY, ROBOT-ASSISTED
Anesthesia: General | Laterality: Right

## 2024-03-01 MED ORDER — OXYCODONE HCL 5 MG PO TABS: 5.0000 mg | ORAL_TABLET | Freq: Once | ORAL | Status: DC | PRN

## 2024-03-01 MED ORDER — EPHEDRINE SULFATE-NACL 50-0.9 MG/10ML-% IV SOSY
PREFILLED_SYRINGE | INTRAVENOUS | Status: DC | PRN
  Administered 2024-03-01: 10 mg via INTRAVENOUS

## 2024-03-01 MED ORDER — ORAL CARE MOUTH RINSE: 15.0000 mL | OROMUCOSAL | Status: DC | PRN

## 2024-03-01 MED ORDER — BUPIVACAINE HCL (PF) 0.5 % IJ SOLN
INTRAMUSCULAR | Status: AC
  Filled 2024-03-01: qty 30

## 2024-03-01 MED ORDER — LACTATED RINGERS IV SOLN: INTRAVENOUS | Status: DC

## 2024-03-01 MED ORDER — PROPOFOL 10 MG/ML IV BOLUS
INTRAVENOUS | Status: DC | PRN
  Administered 2024-03-01 (×2): 50 mg via INTRAVENOUS
  Administered 2024-03-01: 100 mg via INTRAVENOUS

## 2024-03-01 MED ORDER — BISACODYL 5 MG PO TBEC
10.0000 mg | DELAYED_RELEASE_TABLET | Freq: Every day | ORAL | Status: DC
  Administered 2024-03-03: 10 mg via ORAL
  Filled 2024-03-01 (×2): qty 2

## 2024-03-01 MED ORDER — PHENYLEPHRINE 80 MCG/ML (10ML) SYRINGE FOR IV PUSH (FOR BLOOD PRESSURE SUPPORT)
PREFILLED_SYRINGE | INTRAVENOUS | Status: AC
  Filled 2024-03-01: qty 10

## 2024-03-01 MED ORDER — KETOROLAC TROMETHAMINE 15 MG/ML IJ SOLN
15.0000 mg | Freq: Four times a day (QID) | INTRAMUSCULAR | Status: DC
  Administered 2024-03-01 – 2024-03-03 (×7): 15 mg via INTRAVENOUS
  Filled 2024-03-01 (×7): qty 1

## 2024-03-01 MED ORDER — PHENYLEPHRINE 80 MCG/ML (10ML) SYRINGE FOR IV PUSH (FOR BLOOD PRESSURE SUPPORT)
PREFILLED_SYRINGE | INTRAVENOUS | Status: DC | PRN
  Administered 2024-03-01: 80 ug via INTRAVENOUS

## 2024-03-01 MED ORDER — ACETAMINOPHEN 500 MG PO TABS
1000.0000 mg | ORAL_TABLET | Freq: Four times a day (QID) | ORAL | Status: DC
  Administered 2024-03-01 – 2024-03-03 (×7): 1000 mg via ORAL
  Filled 2024-03-01 (×7): qty 2

## 2024-03-01 MED ORDER — CHLORHEXIDINE GLUCONATE 0.12 % MT SOLN
15.0000 mL | Freq: Once | OROMUCOSAL | Status: AC
  Administered 2024-03-01: 15 mL via OROMUCOSAL
  Filled 2024-03-01: qty 15

## 2024-03-01 MED ORDER — ONDANSETRON HCL 4 MG/2ML IJ SOLN: 4.0000 mg | Freq: Four times a day (QID) | INTRAMUSCULAR | Status: DC | PRN

## 2024-03-01 MED ORDER — PYRIDOSTIGMINE BROMIDE 60 MG PO TABS
60.0000 mg | ORAL_TABLET | Freq: Two times a day (BID) | ORAL | Status: DC
  Administered 2024-03-01 – 2024-03-03 (×4): 60 mg via ORAL
  Filled 2024-03-01 (×5): qty 1

## 2024-03-01 MED ORDER — ONDANSETRON HCL 4 MG/2ML IJ SOLN: 4.0000 mg | Freq: Once | INTRAMUSCULAR | Status: DC | PRN

## 2024-03-01 MED ORDER — SODIUM CHLORIDE 0.9 % IV SOLN: INTRAVENOUS | Status: DC | PRN

## 2024-03-01 MED ORDER — ONDANSETRON HCL 4 MG/2ML IJ SOLN
INTRAMUSCULAR | Status: AC
  Filled 2024-03-01: qty 2

## 2024-03-01 MED ORDER — PROPOFOL 10 MG/ML IV BOLUS
INTRAVENOUS | Status: AC
  Filled 2024-03-01: qty 20

## 2024-03-01 MED ORDER — LUNG SURGERY BOOK
Freq: Once | Status: AC
  Filled 2024-03-01: qty 1

## 2024-03-01 MED ORDER — ENOXAPARIN SODIUM 40 MG/0.4ML IJ SOSY
40.0000 mg | PREFILLED_SYRINGE | Freq: Every day | INTRAMUSCULAR | Status: DC
  Administered 2024-03-02: 40 mg via SUBCUTANEOUS
  Filled 2024-03-01: qty 0.4

## 2024-03-01 MED ORDER — ORAL CARE MOUTH RINSE: 15.0000 mL | Freq: Once | OROMUCOSAL | Status: AC

## 2024-03-01 MED ORDER — EPHEDRINE 5 MG/ML INJ
INTRAVENOUS | Status: AC
  Filled 2024-03-01: qty 5

## 2024-03-01 MED ORDER — OXYCODONE HCL 5 MG/5ML PO SOLN: 5.0000 mg | Freq: Once | ORAL | Status: DC | PRN

## 2024-03-01 MED ORDER — 0.9 % SODIUM CHLORIDE (POUR BTL) OPTIME
TOPICAL | Status: DC | PRN
  Administered 2024-03-01: 1000 mL

## 2024-03-01 MED ORDER — DEXAMETHASONE SODIUM PHOSPHATE 10 MG/ML IJ SOLN
INTRAMUSCULAR | Status: DC | PRN
  Administered 2024-03-01: 10 mg via INTRAVENOUS

## 2024-03-01 MED ORDER — ONDANSETRON HCL 4 MG/2ML IJ SOLN
INTRAMUSCULAR | Status: DC | PRN
Start: 2024-03-01 — End: 2024-03-01
  Administered 2024-03-01: 4 mg via INTRAVENOUS

## 2024-03-01 MED ORDER — FENTANYL CITRATE (PF) 100 MCG/2ML IJ SOLN
INTRAMUSCULAR | Status: AC
  Filled 2024-03-01: qty 2

## 2024-03-01 MED ORDER — FENTANYL CITRATE (PF) 100 MCG/2ML IJ SOLN
25.0000 ug | INTRAMUSCULAR | Status: AC | PRN
  Administered 2024-03-01 (×6): 25 ug via INTRAVENOUS

## 2024-03-01 MED ORDER — MORPHINE SULFATE (PF) 2 MG/ML IV SOLN: 2.0000 mg | INTRAVENOUS | Status: DC | PRN

## 2024-03-01 MED ORDER — LIDOCAINE 2% (20 MG/ML) 5 ML SYRINGE
INTRAMUSCULAR | Status: DC | PRN
  Administered 2024-03-01: 100 mg via INTRAVENOUS

## 2024-03-01 MED ORDER — CEFAZOLIN SODIUM-DEXTROSE 2-4 GM/100ML-% IV SOLN
2.0000 g | Freq: Three times a day (TID) | INTRAVENOUS | Status: AC
  Administered 2024-03-01 – 2024-03-02 (×2): 2 g via INTRAVENOUS
  Filled 2024-03-01 (×2): qty 100

## 2024-03-01 MED ORDER — HEMOSTATIC AGENTS (NO CHARGE) OPTIME
TOPICAL | Status: DC | PRN
  Administered 2024-03-01: 1 via TOPICAL

## 2024-03-01 MED ORDER — SENNOSIDES-DOCUSATE SODIUM 8.6-50 MG PO TABS
1.0000 | ORAL_TABLET | Freq: Every day | ORAL | Status: DC
  Administered 2024-03-01 – 2024-03-02 (×2): 1 via ORAL
  Filled 2024-03-01 (×2): qty 1

## 2024-03-01 MED ORDER — HYDROCHLOROTHIAZIDE 25 MG PO TABS
25.0000 mg | ORAL_TABLET | Freq: Every evening | ORAL | Status: DC
  Administered 2024-03-02: 25 mg via ORAL
  Filled 2024-03-01: qty 1

## 2024-03-01 MED ORDER — DEXAMETHASONE SODIUM PHOSPHATE 10 MG/ML IJ SOLN
INTRAMUSCULAR | Status: AC
  Filled 2024-03-01: qty 1

## 2024-03-01 MED ORDER — CLEVIDIPINE BUTYRATE 0.5 MG/ML IV EMUL
INTRAVENOUS | Status: DC | PRN
  Administered 2024-03-01: 2 mg/h via INTRAVENOUS

## 2024-03-01 MED ORDER — TRAMADOL HCL 50 MG PO TABS: 50.0000 mg | ORAL_TABLET | Freq: Four times a day (QID) | ORAL | Status: DC | PRN

## 2024-03-01 MED ORDER — GABAPENTIN 300 MG PO CAPS
300.0000 mg | ORAL_CAPSULE | Freq: Every day | ORAL | Status: DC
  Administered 2024-03-01 – 2024-03-02 (×2): 300 mg via ORAL
  Filled 2024-03-01 (×2): qty 1

## 2024-03-01 MED ORDER — LEVOTHYROXINE SODIUM 50 MCG PO TABS
50.0000 ug | ORAL_TABLET | Freq: Every day | ORAL | Status: DC
  Administered 2024-03-02 – 2024-03-03 (×2): 50 ug via ORAL
  Filled 2024-03-01 (×2): qty 1

## 2024-03-01 MED ORDER — SUGAMMADEX SODIUM 200 MG/2ML IV SOLN
INTRAVENOUS | Status: DC | PRN
  Administered 2024-03-01: 200 mg via INTRAVENOUS

## 2024-03-01 MED ORDER — MIDAZOLAM HCL 2 MG/2ML IJ SOLN
INTRAMUSCULAR | Status: AC
  Filled 2024-03-01: qty 2

## 2024-03-01 MED ORDER — PHENYLEPHRINE HCL-NACL 20-0.9 MG/250ML-% IV SOLN
INTRAVENOUS | Status: DC | PRN
  Administered 2024-03-01: 25 ug/min via INTRAVENOUS

## 2024-03-01 MED ORDER — ALBUMIN HUMAN 5 % IV SOLN: INTRAVENOUS | Status: DC | PRN

## 2024-03-01 MED ORDER — BUPIVACAINE LIPOSOME 1.3 % IJ SUSP
INTRAMUSCULAR | Status: DC | PRN
  Administered 2024-03-01: 15 mL

## 2024-03-01 MED ORDER — FENTANYL CITRATE (PF) 250 MCG/5ML IJ SOLN
INTRAMUSCULAR | Status: DC | PRN
  Administered 2024-03-01 (×2): 100 ug via INTRAVENOUS
  Administered 2024-03-01: 50 ug via INTRAVENOUS

## 2024-03-01 MED ORDER — ROCURONIUM BROMIDE 10 MG/ML (PF) SYRINGE
PREFILLED_SYRINGE | INTRAVENOUS | Status: DC | PRN
  Administered 2024-03-01: 50 mg via INTRAVENOUS
  Administered 2024-03-01: 14 mg via INTRAVENOUS
  Administered 2024-03-01: 20 mg via INTRAVENOUS

## 2024-03-01 MED ORDER — DEXTROSE-SODIUM CHLORIDE 5-0.9 % IV SOLN: INTRAVENOUS | Status: DC

## 2024-03-01 MED ORDER — FENTANYL CITRATE (PF) 250 MCG/5ML IJ SOLN
INTRAMUSCULAR | Status: AC
  Filled 2024-03-01: qty 5

## 2024-03-01 MED ORDER — GABAPENTIN 300 MG PO CAPS: 300.0000 mg | ORAL_CAPSULE | Freq: Two times a day (BID) | ORAL | Status: DC

## 2024-03-01 MED ORDER — ACETAMINOPHEN 10 MG/ML IV SOLN
INTRAVENOUS | Status: AC
  Filled 2024-03-01: qty 100

## 2024-03-01 MED ORDER — ACETAMINOPHEN 10 MG/ML IV SOLN
1000.0000 mg | Freq: Once | INTRAVENOUS | Status: DC | PRN
  Administered 2024-03-01: 1000 mg via INTRAVENOUS

## 2024-03-01 MED ORDER — LIDOCAINE 2% (20 MG/ML) 5 ML SYRINGE
INTRAMUSCULAR | Status: AC
  Filled 2024-03-01: qty 5

## 2024-03-01 MED ORDER — PANTOPRAZOLE SODIUM 40 MG PO TBEC
40.0000 mg | DELAYED_RELEASE_TABLET | Freq: Every day | ORAL | Status: DC
  Administered 2024-03-02 – 2024-03-03 (×2): 40 mg via ORAL
  Filled 2024-03-01 (×2): qty 1

## 2024-03-01 MED ORDER — BUPIVACAINE LIPOSOME 1.3 % IJ SUSP
INTRAMUSCULAR | Status: AC
  Filled 2024-03-01: qty 20

## 2024-03-01 MED ORDER — ROCURONIUM BROMIDE 10 MG/ML (PF) SYRINGE
PREFILLED_SYRINGE | INTRAVENOUS | Status: AC
  Filled 2024-03-01: qty 10

## 2024-03-01 MED ORDER — ACETAMINOPHEN 160 MG/5ML PO SOLN: 1000.0000 mg | Freq: Four times a day (QID) | ORAL | Status: DC

## 2024-03-01 MED ORDER — OXYCODONE HCL 5 MG PO TABS: 5.0000 mg | ORAL_TABLET | ORAL | Status: DC | PRN

## 2024-03-01 MED ORDER — DROPERIDOL 2.5 MG/ML IJ SOLN: 0.6250 mg | Freq: Once | INTRAMUSCULAR | Status: DC | PRN

## 2024-03-01 MED ORDER — CEFAZOLIN SODIUM-DEXTROSE 2-4 GM/100ML-% IV SOLN
2.0000 g | INTRAVENOUS | Status: AC
  Administered 2024-03-01: 2 g via INTRAVENOUS
  Filled 2024-03-01: qty 100

## 2024-03-01 SURGICAL SUPPLY — 46 items
BLADE STERNUM SYSTEM 6 (BLADE) ×1 IMPLANT
CLIP LIGATING HEMO O LOK GREEN (MISCELLANEOUS) IMPLANT
CNTNR URN SCR LID CUP LEK RST (MISCELLANEOUS) ×3 IMPLANT
DEFOGGER SCOPE WARM SEASHARP (MISCELLANEOUS) ×1 IMPLANT
DERMABOND ADVANCED .7 DNX12 (GAUZE/BANDAGES/DRESSINGS) ×1 IMPLANT
DRAIN CHANNEL 19F RND (DRAIN) IMPLANT
DRAIN CONNECTOR BLAKE 1:1 (MISCELLANEOUS) IMPLANT
DRAPE ARM DVNC X/XI (DISPOSABLE) ×4 IMPLANT
DRAPE COLUMN DVNC XI (DISPOSABLE) ×1 IMPLANT
DRAPE CV SPLIT W-CLR ANES SCRN (DRAPES) ×1 IMPLANT
DRAPE SURG ORHT 6 SPLT 77X108 (DRAPES) ×1 IMPLANT
ELECTRODE REM PT RTRN 9FT ADLT (ELECTROSURGICAL) ×1 IMPLANT
FORCEPS BPLR FENES DVNC XI (FORCEP) IMPLANT
FORCEPS BPLR LNG DVNC XI (INSTRUMENTS) IMPLANT
GAUZE KITTNER 4X5 RF (MISCELLANEOUS) ×1 IMPLANT
GAUZE SPONGE 4X4 12PLY STRL (GAUZE/BANDAGES/DRESSINGS) IMPLANT
GLOVE SS BIOGEL STRL SZ 7.5 (GLOVE) ×2 IMPLANT
GOWN STRL REUS W/ TWL LRG LVL3 (GOWN DISPOSABLE) ×2 IMPLANT
GOWN STRL REUS W/ TWL XL LVL3 (GOWN DISPOSABLE) ×2 IMPLANT
GOWN STRL REUS W/TWL 2XL LVL3 (GOWN DISPOSABLE) ×1 IMPLANT
GRASPER TIP-UP FEN DVNC XI (INSTRUMENTS) IMPLANT
HEMOSTAT SURGICEL 2X14 (HEMOSTASIS) ×1 IMPLANT
IRRIGATION STRYKERFLOW (MISCELLANEOUS) ×1 IMPLANT
NDL HYPO 25GX1X1/2 BEV (NEEDLE) ×1 IMPLANT
NEEDLE HYPO 25GX1X1/2 BEV (NEEDLE) ×1 IMPLANT
PACK CHEST (CUSTOM PROCEDURE TRAY) ×1 IMPLANT
PAD ARMBOARD POSITIONER FOAM (MISCELLANEOUS) ×2 IMPLANT
PAD ELECT DEFIB RADIOL ZOLL (MISCELLANEOUS) IMPLANT
PORT ACCESS TROCAR AIRSEAL 12 (TROCAR) ×1 IMPLANT
SEAL UNIV 5-12 XI (MISCELLANEOUS) ×4 IMPLANT
SEALER SYNCHRO 8 IS4000 DVNC (MISCELLANEOUS) IMPLANT
SET TRI-LUMEN FLTR TB AIRSEAL (TUBING) ×1 IMPLANT
SOLUTION ELECTROSURG ANTI STCK (MISCELLANEOUS) ×1 IMPLANT
SPONGE TONSIL 1 RF SGL (DISPOSABLE) ×1 IMPLANT
SUT PROLENE 4-0 RB1 .5 CRCL 36 (SUTURE) IMPLANT
SUT SILK 1 MH (SUTURE) ×1 IMPLANT
SUT SILK 2 0 SH (SUTURE) IMPLANT
SUT VIC AB 3-0 X1 27 (SUTURE) ×2 IMPLANT
SUT VICRYL 0 TIES 12 18 (SUTURE) ×1 IMPLANT
SUT VICRYL 0 UR6 27IN ABS (SUTURE) ×2 IMPLANT
SYR 20ML LL LF (SYRINGE) ×2 IMPLANT
SYSTEM RETRIEVAL ANCHOR 8 (MISCELLANEOUS) IMPLANT
SYSTEM SAHARA CHEST DRAIN ATS (WOUND CARE) IMPLANT
TOWEL GREEN STERILE (TOWEL DISPOSABLE) IMPLANT
TOWEL GREEN STERILE FF (TOWEL DISPOSABLE) IMPLANT
TRAY FOLEY SLVR 16FR TEMP STAT (SET/KITS/TRAYS/PACK) ×1 IMPLANT

## 2024-03-01 NOTE — Transfer of Care (Signed)
 Immediate Anesthesia Transfer of Care Note  Patient: Chelsey Thompson  Procedure(s) Performed: THYMECTOMY, ROBOT-ASSISTED (Right)  Patient Location: PACU  Anesthesia Type:General  Level of Consciousness: awake, alert , and oriented  Airway & Oxygen Therapy: Patient Spontanous Breathing and Patient connected to face mask oxygen  Post-op Assessment: Report given to RN and Post -op Vital signs reviewed and stable  Post vital signs: Reviewed and stable  Last Vitals:  Vitals Value Taken Time  BP    Temp 36.4 C 03/01/24 14:00  Pulse 75 03/01/24 14:04  Resp 28 03/01/24 14:04  SpO2 95 % 03/01/24 14:04  Vitals shown include unfiled device data.  Last Pain:  Vitals:   03/01/24 0912  TempSrc: Oral  PainSc: 0-No pain         Complications: No notable events documented.

## 2024-03-01 NOTE — Op Note (Signed)
 NAME: Chelsey Thompson, Chelsey Thompson. MEDICAL RECORD NO: [id number] ACCOUNT NO: [id number] DATE OF BIRTH: 1960/12/12 FACILITY: [facility] LOCATION: [facility] PHYSICIAN: Elspeth BROCKS. Kerrin, MD  Operative Report   DATE OF PROCEDURE: 03/01/2024  PREOPERATIVE DIAGNOSIS:  Thymic mass.  POSTOPERATIVE DIAGNOSIS:  Thymic mass.  PROCEDURE:  Xi robotic assisted right VATS for thymectomy.  SURGEON:  Elspeth BROCKS. Kerrin, MD  ASSISTANT:  Lemond Cera, PA  ANESTHESIA:  General.  An experienced assistance was necessary for this case due to surgical complexity.  Lemond Cera assisted with port placement, robot docking and undocking, instrument exchange, specimen retrieval, suctioning, and wound closure.  FINDINGS:  Mass clearly visible in the inferior aspect of the anterior mediastinal fat pad- encapsulated with no invasion of surrounding structures.  CLINICAL NOTE:  Ms. Chelsey Thompson is a 63 year old woman who originally presented with facial weakness.  She had a CT for coronary calcium scoring, which showed a 2.3 x 1.6 cm anterior mediastinal mass.  She was diagnosed with myasthenia and started on Mestinon .  She refused prednisone .  She was advised to undergo thymectomy for thymoma.  The indications, risks, benefits, and alternatives were discussed in detail with the patient.  She understood and accepted the risks and agreed to proceed.  OPERATIVE NOTE:  Ms. Chelsey Thompson was brought to the operating room on 03/01/2024.  She had induction of general anesthesia and was intubated with a double lumen endotracheal tube.  Intravenous antibiotics were administered.  A Foley catheter was placed.  Sequential compression devices were placed on the calves for DVT prophylaxis.  She was left in the supine position.  Her right chest was elevated and the chest and abdomen were prepped and draped in the usual sterile fashion.  A Bair Hugger was in place for active warming.  A timeout was performed.  A solution containing 20 mL of liposome  bupivacaine and 30 mL of 0.5% bupivacaine was prepared and used for local at the incision sites as well as nerve blocks at the interspaces where the trocars were placed.  An 8-mm robotic port was placed in the eighth interspace in the anterior axillary line.  Once intrapleural placement was confirmed, carbon dioxide was insufflated per protocol.  Two additional 8-mm robotic ports were placed, one in the fifth and one in the third  interspace.  A 12-mm AirSeal port was placed in the seventh interspace posterolaterally.  The robot was deployed.  The camera arm was docked.  Targeting was performed.  The remaining arms were docked and the robotic instruments were inserted with thoracoscopic visualization.  The mass was clearly visible to the right in the inferior aspect of the anterior mediastinal fat pad.  The phrenic nerve was clearly visible.  The dissection was begun inferiorly.  A plane was developed.  There was no invasion of the surrounding structures.  The plane under the mass along the pericardium was easily dissected out.  The plane was developed working superiorly on the right side anterior to the phrenic nerve.  Care was taken to not to use cautery in the vicinity of the nerve.  The plane then was developed across the mediastinum.  The pleura was incised medial to the right internal mammary vessels and the left pleural space was entered.  The left side of the fat pad was identified and resected.  The left phrenic nerve was visualized and no cautery was used in its vicinity.  As the dissection continued superiorly, there was a large amount of fat and it was difficult initially to identify the  superior poles.  Ultimately, the right internal mammary vein was encircled, doubly clipped, and divided.  This provided improved exposure and the right and left superior poles respectively were mobilized.  The thymus was mobilized off the innominate vein.  There was a large branching vein and the SynchroSeal device  was used to divide that and there was good hemostasis.  The SynchroSeal was also used to divide the vessels at the superior pole of the thymus bilaterally.  The mass had been completely freed up.  Inspection was made and there was good hemostasis.  The robotic instruments were removed and the robot was undocked.  An 8-mm endoscopic retrieval bag then was placed through the AirSeal port and the mass was manipulated into the bag and removed and sent for permanent pathology.  A 19-French Blake drain was placed through the eighth  interspace incision and directed into the anterior mediastinum.  It did cross into the left pleural space.  It was secured with a #1 silk suture.  Dual lung ventilation was resumed and the remaining incisions were closed in standard fashion.  All sponge, needle, and instrument counts were correct at the end of the procedure.  The patient was extubated in the operating room and taken to the post-anesthetic care unit in good condition.    MUK D: 03/01/2024 5:24:52 pm T: 03/01/2024 10:04:00 pm  JOB: [id number]/ [id number]

## 2024-03-01 NOTE — Hospital Course (Addendum)
  PCP is [name], NP Referring Provider is Caleen Repine, PA-C  HPI:  Chelsey Thompson returns to discuss resection of her anterior mediastinal mass.   Chelsey Thompson is a 63 year old woman with a history of hypertension, hyperlipidemia, fibromyalgia, DCIS of the left breast, left mastectomy with reconstruction, hypothyroidism, angina mediastinal mass, and recently diagnosed myasthenia gravis.   Initially presented with complaints of fatigue, swelling in her legs, and weight gain.  She had a CT for coronary calcium score which showed a 2.3 x 1.6 cm anterior mediastinal mass.   She presented with left-sided facial drooping mainly involving her left eyelid.  She was diagnosed with Bell's palsy.  Symptoms improved with steroids but she did not like the way they made her feel.  Later proven to be myasthenia gravis with positive antibodies.   She saw Dr. Tobie of neurology.  She was started on Mestinon .  Did not notice much improvement with that medication.  She refused to go back on prednisone .  Dr. Kerrin reviewed the patient's diagnostic studies and determined he would benefit from surgical intervention. He reviewed the patient's treatment options as well as the risks and benefits of surgery. Chelsey Thompson was agreeable to proceed with surgery.  Hospital Course: Chelsey Thompson presented to [facility] and was brought to the operating room on 03/01/2024. She underwent robotic assisted resection of mediastinal mass without complication and was brought to the PACU in stable condition. A line and foley were removed. Chest tube had no air leak and CXR was stable so chest tube was removed on POD 1. She had hypokalemia and this was supplemented accordingly. She had left ptosis in PACU, was restarted on Mestinon , and left ptosis gradually improved. She has been ambulating on room air with good oxygenation. All wounds are clean, dry, healing without signs of infection. She is tolerating a diet. CXR  this am shows low lung volumes, no pneumothorax, and probable tiny pleural effusions. Per Dr. Kerrin, she is stable for discharge.

## 2024-03-01 NOTE — H&P (Signed)
 PCP is [name], NP Referring Provider is Caleen Repine, [place]      Chief Complaint  Patient presents with   Follow-up   Mediastinal Mass      HPI: [name] returns to discuss resection of her anterior mediastinal mass.   Chelsey Thompson is a 63 year old woman with a history of hypertension, hyperlipidemia, fibromyalgia, DCIS of the left breast, left mastectomy with reconstruction, hypothyroidism, angina mediastinal mass, and recently diagnosed myasthenia gravis.   Initially presented with complaints of fatigue, swelling in her legs, and weight gain.  She had a CT for coronary calcium score which showed a 2.3 x 1.6 cm anterior mediastinal mass.   She presented with left-sided facial drooping mainly involving her left eyelid.  She was diagnosed with Bell's palsy.  Symptoms improved with steroids but she did not like the way they made her feel.  Later proven to be myasthenia gravis with positive antibodies.   She saw Dr. Tobie of neurology.  She was started on Mestinon .  Did not notice much improvement with that medication.  She refused to go back on prednisone .   Zubrod Score: At the time of surgery this patient's most appropriate activity status/level should be described as: []     0    Normal activity, no symptoms [x]     1    Restricted in physical strenuous activity but ambulatory, able to do out light work []     2    Ambulatory and capable of self care, unable to do work activities, up and about >50 % of waking hours                              []     3    Only limited self care, in bed greater than 50% of waking hours []     4    Completely disabled, no self care, confined to bed or chair []     5    Moribund       Past Medical History:  Diagnosis Date   Ductal carcinoma in situ (DCIS) of left breast 2008    s/p mastectomy, reconstruction   Fibromyalgia     Frequent headaches     HLD (hyperlipidemia)     Hypertension     Hypothyroidism                 Past Surgical  History:  Procedure Laterality Date   BREAST SURGERY Left      Cancer and implant   MASTECTOMY Left     REDUCTION MAMMAPLASTY Right                 Family History  Problem Relation Age of Onset   COPD Mother     Cancer Father     Breast cancer Neg Hx            Social History Social History  Social History         Tobacco Use   Smoking status: Former      Current packs/day: 0.00      Types: Cigarettes      Quit date: 05/31/1987      Years since quitting: 36.6   Smokeless tobacco: Never  Substance Use Topics   Alcohol use: Not Currently      Comment: Drank heavily for 14 years.   Drug use: No              Current Outpatient  Medications  Medication Sig Dispense Refill   hydrochlorothiazide (HYDRODIURIL) 25 MG tablet Take 25 mg by mouth daily.       pyridostigmine  (MESTINON ) 60 MG tablet Take 1 tablet at 7a, noon, and 5pm. 90 tablet 5   SYNTHROID 25 MCG tablet Take 50 mcg by mouth daily before breakfast.          No current facility-administered medications for this visit.        Allergies       Allergies  Allergen Reactions   Sulfa Antibiotics        Rash?        Review of Systems  Constitutional:  Positive for activity change, fatigue and unexpected weight change.  HENT:  Positive for trouble swallowing. Negative for voice change.   Eyes:  Positive for visual disturbance (left eye ptosis).  Cardiovascular:  Positive for leg swelling.  Neurological:  Positive for weakness.     BP (!) 149/85   Pulse (!) 57   Resp 18   Ht 5' 6 (1.676 m)   Wt 206 lb (93.4 kg)   LMP 02/03/2015   SpO2 94%   BMI 33.25 kg/m  Physical Exam Vitals reviewed.  Constitutional:      Appearance: Normal appearance.  Eyes:     General: No scleral icterus.    Pupils: Pupils are equal, round, and reactive to light.     Comments: Mild left ptosis  Cardiovascular:     Rate and Rhythm: Normal rate and regular rhythm.     Heart sounds: Normal heart sounds. No murmur  heard. Pulmonary:     Effort: Pulmonary effort is normal. No respiratory distress.     Breath sounds: Normal breath sounds. No wheezing.  Abdominal:     General: There is no distension.     Palpations: Abdomen is soft.     Tenderness: There is no abdominal tenderness.  Musculoskeletal:     Cervical back: Neck supple.  Lymphadenopathy:     Cervical: No cervical adenopathy.  Skin:    General: Skin is warm and dry.  Neurological:     General: No focal deficit present.     Mental Status: She is alert and oriented to person, place, and time.     Cranial Nerves: No cranial nerve deficit.     Motor: No weakness.       Diagnostic Tests: CT CHEST WITH CONTRAST   TECHNIQUE: Multidetector CT imaging of the chest was performed during intravenous contrast administration.   RADIATION DOSE REDUCTION: This exam was performed according to the departmental dose-optimization program which includes automated exposure control, adjustment of the mA and/or kV according to patient size and/or use of iterative reconstruction technique.   CONTRAST:  100mL ISOVUE -300 IOPAMIDOL  (ISOVUE -300) INJECTION 61%   COMPARISON:  Partial comparison to cardiac CT dated 09/27/2023   FINDINGS: Cardiovascular: Heart is normal in size.  No pericardial effusion.   No evidence of thoracic aortic aneurysm. Mild atherosclerotic calcifications of the aortic arch.   Mild three-vessel coronary atherosclerosis.   Mediastinum/Nodes: No suspicious mediastinal lymphadenopathy.   1.7 x 2.2 cm soft tissue lesion along the right anterior mediastinum with peripheral calcification (image 58), raising concern for an anterior mediastinal mass such as thymoma.   Visualized thyroid  is unremarkable.   Lungs/Pleura: No suspicious pulmonary nodules.   Mild scarring in the bilateral lower lobes and medial right middle lobe.   No focal consolidation.   Mild centrilobular emphysematous changes, upper lung predominant.    Upper  Abdomen: Visualized upper abdomen is grossly unremarkable.   Musculoskeletal: Status post left mastectomy with reconstruction. Mild midthoracic dextroscoliosis.   IMPRESSION: 2.2 cm soft tissue lesion along the right anterior mediastinum, raising concern for an anterior mediastinal mass such as thymoma. Consider thoracic surgery consultation for further evaluation.   Status post left mastectomy. No evidence of recurrent or metastatic disease.   Aortic Atherosclerosis (ICD10-I70.0) and Emphysema (ICD10-J43.9).     Electronically Signed   By: Pinkie Pebbles M.D.   On: 11/02/2023 23:02 I personally reviewed the CT images.  There is a 1.7 x 2.2 cm soft tissue mass in the anterior mediastinum with peripheral calcification.  Postmastectomy.  Aortic atherosclerosis.   Acetylcholine blocking antibodies 44  Acetylcholine receptor binding antibodies 4.56   Impression: Chelsey Thompson is a 63 year old woman with a history of hypertension, hyperlipidemia, fibromyalgia, DCIS of the left breast, left mastectomy with reconstruction, hypothyroidism, anterior mediastinal mass, and recently diagnosed myasthenia gravis.   Anterior mediastinal mass.  Almost certainly a thymoma given her history.  Needs to be resected.  Germ cell tumors, ectopic thyroid , and lymphoma are also in the differential.  Relatively small tumor amenable to minimally invasive resection.   I recommended that we proceed with a robotic assisted right VATS for thymectomy.   I discussed the general nature of the procedure, including the need for general anesthesia, the incisions to be used, the use of the surgical robot, and drainage tubes postoperatively with Ms. Curenton.  We discussed the expected hospital stay, overall recovery and short and long term outcomes. I informed her of the indications, risks, benefits and alternatives.   She understands the risks include, but are not limited to death, stroke, MI, DVT/PE, bleeding,  possible need for transfusion, infections, cardiac arrhythmias, as well as other organ system dysfunction including respiratory, renal, or GI complications.  She understands the possible need for conversion to an open procedure.   She does have myasthenia.  She is on Mestinon  but says she has not noticed much of a difference with the medication.  I informed her that she may need steroids around the time of surgery.  Also plan to consult anesthesiology preoperatively so that they can plan their anesthetic accordingly.     Plan:   Will schedule robotic assisted right VATS for thymectomy the first week of October. Will arrange anesthesiology consultation prior to surgery.   [name], MD [facility] [phone]          Electronically signed by Millers Elspeth JAYSON, MD at 01/30/2024  5:13 PM

## 2024-03-01 NOTE — Anesthesia Procedure Notes (Addendum)
 Procedure Name: Intubation Date/Time: 03/01/2024 11:31 AM  Performed by: Delores Dus, CRNAPre-anesthesia Checklist: Patient identified, Emergency Drugs available, Suction available and Patient being monitored Patient Re-evaluated:Patient Re-evaluated prior to induction Oxygen Delivery Method: Circle system utilized Preoxygenation: Pre-oxygenation with 100% oxygen Induction Type: IV induction Ventilation: Mask ventilation without difficulty Laryngoscope Size: Glidescope, 3, Mac, Miller and 2 Grade View: Grade I Tube type: Oral Endobronchial tube: Left, EBT position confirmed by fiberoptic bronchoscope, EBT position confirmed by auscultation and Double lumen EBT and 37 Fr Number of attempts: 1 Airway Equipment and Method: Stylet and Oral airway Placement Confirmation: ETT inserted through vocal cords under direct vision, positive ETCO2 and breath sounds checked- equal and bilateral Tube secured with: Tape Dental Injury: Teeth and Oropharynx as per pre-operative assessment  Comments: Placed on third attempt.

## 2024-03-01 NOTE — Anesthesia Preprocedure Evaluation (Addendum)
 Anesthesia Evaluation  Patient identified by MRN, date of birth, ID band Patient awake    Reviewed: Allergy & Precautions, NPO status , Patient's Chart, lab work & pertinent test results, reviewed documented beta blocker date and time   History of Anesthesia Complications (+) PONV and history of anesthetic complications  Airway Mallampati: II       Dental  (+) Teeth Intact, Dental Advisory Given   Pulmonary former smoker   breath sounds clear to auscultation       Cardiovascular hypertension, + CAD   Rhythm:Regular Rate:Normal     Neuro/Psych  Headaches  Neuromuscular disease (Myasthenia Gravis)    GI/Hepatic   Endo/Other  Hypothyroidism    Renal/GU      Musculoskeletal  (+)  Fibromyalgia -  Abdominal   Peds  Hematology   Anesthesia Other Findings   Reproductive/Obstetrics                              Anesthesia Physical Anesthesia Plan  ASA: 3  Anesthesia Plan: General   Post-op Pain Management:    Induction: Intravenous  PONV Risk Score and Plan: 1 and Ondansetron  Airway Management Planned: Oral ETT  Additional Equipment: Arterial line  Intra-op Plan:   Post-operative Plan: Possible Post-op intubation/ventilation and Extubation in OR  Informed Consent:      Dental advisory given  Plan Discussed with: CRNA and Surgeon  Anesthesia Plan Comments: (63  year old female with PMH of HTN, hypothyroidism, history of breast cancer s/p L mastectomy, elevated calcium score and recent diagnosis of myasthenia gravis - planned for thymectomy after incidental finding on imaging. CT over read a 2.3 x 1.6 cm soft tissue density in anterior mediastinum was incidentally noted. Pyridostigmine  dose ~120-180 mg daily with history of ocular symptoms predominately. Functional status stable. Will perform POCUS cardiac exam given recent TTE was not performed. Plan for 35Fr L DLT, pre-induction  R arterial line, 2 x large bore PIV (bilaterally placed) and possible postoperative ventilation given history of MG. Type and screen active - will crossmatch x 2 units of pRBCs to have in the room. )         Anesthesia Quick Evaluation

## 2024-03-01 NOTE — Discharge Instructions (Addendum)
Discharge Instructions: ° °1. You may shower, please wash incisions daily with soap and water and keep dry.  If you wish to cover wounds with dressing you may do so but please keep clean and change daily.  No tub baths or swimming until incisions have completely healed.  If your incisions become red or develop any drainage please call our office at [phone] ° °2. No Driving until cleared by surgeons office and you are no longer using narcotic pain medications ° °3. Monitor your weight daily.. Please use the same scale and weigh at same time... If you gain 5-10 lbs in 48 hours with associated lower extremity swelling, please contact our office at [phone] ° °4. Fever of 101.5 for at least 24 hours with no source, please contact our office at [phone] ° °5. Activity- up as tolerated, please walk at least 3 times per day.  Avoid strenuous activity, no lifting, pushing, or pulling with your arms over 8-10 lbs for a minimum of 6 weeks ° °6. If any questions or concerns arise, please do not hesitate to contact our office at [phone] °

## 2024-03-01 NOTE — Brief Op Note (Addendum)
 03/01/2024  1:43 PM  PATIENT:  Chelsey Thompson  63 y.o. female  PRE-OPERATIVE DIAGNOSIS:  THYMOMA  POST-OPERATIVE DIAGNOSIS:  THYMOMA  PROCEDURE:  Procedure(s): THYMECTOMY, ROBOT-ASSISTED (Right)  SURGEON:  Surgeons and Role:    * Kerrin Elspeth BROCKS, MD - Primary  PHYSICIAN ASSISTANT: Akiba Melfi PA-C  ASSISTANTS: none   ANESTHESIA:   local, general, and    EBL:  25 mL   BLOOD ADMINISTERED:none  DRAINS: (19 ) Blake drain(s) in the mediastinum   LOCAL MEDICATIONS USED:  BUPIVICAINE  and OTHER Exparel  SPECIMEN:  Source of Specimen:  Mediastinal mass  DISPOSITION OF SPECIMEN:  PATHOLOGY  COUNTS:  YES  TOURNIQUET:  * No tourniquets in log *  DICTATION: .Other Dictation: Dictation Number pending  PLAN OF CARE: Admit to inpatient   PATIENT DISPOSITION:  PACU - hemodynamically stable.   Delay start of Pharmacological VTE agent (>24hrs) due to surgical blood loss or risk of bleeding: no  Complications: No known

## 2024-03-01 NOTE — Discharge Summary (Addendum)
 Physician Discharge Summary                [address]               Thurmon BROCKS [place], [place] [address]                      [phone]   Patient ID: Chelsey Thompson MRN: [id number] DOB/AGE: May 05, 1961 63 y.o.  Admit date: 03/01/2024 Discharge date: 03/03/2024  Admission Diagnoses: Anterior mediastinal mass Myasthenia gravis  Discharge Diagnoses:  Thymoma, type B2- T1a,N0 Principal Problem:   Status post robot-assisted surgical procedure Active Problems:   Mediastinal mass Myasthenia gravis  Patient Active Problem List   Diagnosis Date Noted   Status post robot-assisted surgical procedure 03/01/2024   Mediastinal mass 03/01/2024   Chest pain of uncertain etiology 12/25/2023   Hyperlipidemia 12/25/2023   Elevated coronary artery calcium score 12/25/2023   Morbid obesity (HCC) 12/25/2023   Neck strain 12/01/2016   Concussion 10/04/2016   Hypertension 09/02/2013   Headache 09/02/2013     Consults: None  Procedure (s): Robotic assisted right thoracoscopy surgery for mediastinal mass resection by Dr. Kerrin on 03/01/2024.  Pathology:Final result pending  PCP is [name], NP Referring Provider is Caleen Repine, PA-C  HPI:  Chelsey Thompson returns to discuss resection of her anterior mediastinal mass.   Chelsey Thompson is a 63 year old woman with a history of hypertension, hyperlipidemia, fibromyalgia, DCIS of the left breast, left mastectomy with reconstruction, hypothyroidism, angina mediastinal mass, and recently diagnosed myasthenia gravis.   Initially presented with complaints of fatigue, swelling in her legs, and weight gain.  She had a CT for coronary calcium score which showed a 2.3 x 1.6 cm anterior mediastinal mass.   She presented with left-sided facial drooping mainly involving her left eyelid.  She was diagnosed with Bell's palsy.  Symptoms improved with steroids but she did not like the way they made her feel.  Later proven to be  myasthenia gravis with positive antibodies.   She saw Dr. Tobie of neurology.  She was started on Mestinon .  Did not notice much improvement with that medication.  She refused to go back on prednisone .  Dr. Kerrin reviewed the patient's diagnostic studies and determined he would benefit from surgical intervention. He reviewed the patient's treatment options as well as the risks and benefits of surgery. Chelsey Thompson was agreeable to proceed with surgery.  Hospital Course: Chelsey Thompson presented to [facility] and was brought to the operating room on 03/01/2024. She underwent robotic assisted resection of mediastinal mass without complication and was brought to the PACU in stable condition. A line and foley were removed. Chest tube had no air leak and CXR was stable so chest tube was removed on POD 1. She had hypokalemia and this was supplemented accordingly. She had left ptosis in PACU, was restarted on Mestinon , and left ptosis gradually improved. She has been ambulating on room air with good oxygenation. All wounds are clean, dry, healing without signs of infection. She is tolerating a diet. CXR this am shows low lung volumes, no pneumothorax, and probable tiny pleural effusions. Per Dr. Kerrin, she is stable for discharge.    Latest Vital Signs: Blood pressure 99/66, pulse 74, temperature 98 F (36.7 C), temperature source Oral, resp. rate 19, height 5' 6 (1.676 m), weight 98.2 kg, last menstrual period 02/03/2015, SpO2 93%.  Physical Exam: General appearance: alert, cooperative, and no distress Neurologic: minimal  left ptosis, smile symmetric Heart: regular rate and rhythm Lungs: clear to auscultation bilaterally  Discharge Condition:Stable  Recent laboratory studies:  Lab Results  Component Value Date   WBC 8.8 03/03/2024   HGB 11.4 (L) 03/03/2024   HCT 33.8 (L) 03/03/2024   MCV 93.4 03/03/2024   PLT 173 03/03/2024   Lab Results  Component Value Date   NA 132  (L) 03/03/2024   K 3.9 03/03/2024   CL 100 03/03/2024   CO2 24 03/03/2024   CREATININE 0.72 03/03/2024   GLUCOSE 109 (H) 03/03/2024      Diagnostic Studies: DG Chest 2 View Result Date: 03/03/2024 CLINICAL DATA:  Status post thymectomy. EXAM: CHEST - 2 VIEW COMPARISON:  03/02/2024. FINDINGS: Low volume film. Bibasilar atelectasis. Interval removal of the thoracic drain. No evidence for pneumothorax. The cardiopericardial silhouette is within normal limits for size. Telemetry leads overlie the chest. Nodular opacity seen over the left base previously not evident today although this could be obscured due to lower lung volumes. Probable tiny pleural effusions. IMPRESSION: 1. Low volume film with bibasilar atelectasis and probable tiny pleural effusions. 2. The nodular density seen at the left base is not evident today but could be obscured by the lower lung volumes. 3. No evidence for pneumothorax. Electronically Signed   By: Camellia Candle M.D.   On: 03/03/2024 08:56   DG Chest Port 1 View Result Date: 03/02/2024 CLINICAL DATA:  Status post thymectomy. EXAM: PORTABLE CHEST 1 VIEW COMPARISON:  03/01/2024 FINDINGS: Low lung volumes. The cardiopericardial silhouette is within normal limits for size. Small right pleural effusion. Hazy opacity at the left base likely superimposition of soft tissues. Streaky density in both lung bases compatible with atelectasis. Nodular density peripheral left lower lung towards the base is indeterminate. No pulmonary nodule in this region on chest CT of 11/01/2023 although patient was noted to have anterior left rib fractures at that time. Interval repositioning of the thoracic drain with the tip overlying the upper peripheral right lung. Telemetry leads overlie the chest. IMPRESSION: 1. Interval repositioning of the thoracic drain with the tip overlying the upper peripheral right lung. 2. Small right pleural effusion. 3. Bibasilar atelectasis. 4. Nodular density peripheral  left lower lung towards the base is indeterminate. Attention on follow-up recommended. Electronically Signed   By: Camellia Candle M.D.   On: 03/02/2024 07:45   DG Chest Port 1 View Result Date: 03/01/2024 EXAM: 1 VIEW(S) XRAY OF THE CHEST 03/01/2024 02:32:00 PM COMPARISON: 03/01/2024 CLINICAL HISTORY: Mediastinal mass [id number]. Post-op Mediastinal mass. FINDINGS: LINES, TUBES AND DEVICES: Right chest tube in place with tip projecting over right lung base. LUNGS AND PLEURA: Low lung volumes. Bandlike atelectasis in right lung base. No focal pulmonary opacity. No pulmonary edema. No pleural effusion. No pneumothorax. HEART AND MEDIASTINUM: No acute abnormality of the cardiac and mediastinal silhouettes. BONES AND SOFT TISSUES: No acute osseous abnormality. IMPRESSION: 1. Right chest tube in place with tip projecting over right lung base. 2. Bandlike atelectasis in right lung base. Electronically signed by: Donnice Mania MD 03/01/2024 06:25 PM EDT RP Workstation: [id number]   DG Chest 2 View Result Date: 03/01/2024 CLINICAL DATA:  Preop chest for mediastinal mass. EXAM: CHEST - 2 VIEW COMPARISON:  Chest x-ray 12/14/2006, chest CT 11/01/2023 FINDINGS: Lungs are adequately inflated without focal airspace consolidation or effusion. Cardiomediastinal silhouette and remainder the exam is unchanged. Anterior mediastinal mass seen on prior CT not visible radiographically. IMPRESSION: No acute cardiopulmonary disease. Electronically Signed   By: Toribio  Jearld M.D.   On: 03/01/2024 10:16    Discharge Medications: Allergies as of 03/03/2024       Reactions   Sulfa Antibiotics Rash        Medication List     TAKE these medications    gabapentin 300 MG capsule Commonly known as: Neurontin Take 1 capsule (300 mg total) by mouth 2 (two) times daily.   hydrochlorothiazide 25 MG tablet Commonly known as: HYDRODIURIL Take 25 mg by mouth every evening.   pyridostigmine  60 MG tablet Commonly known as:  Mestinon  Take 1 tablet at 7a, noon, and 5pm. What changed:  how much to take how to take this when to take this additional instructions   Synthroid 50 MCG tablet Generic drug: levothyroxine Take 50 mcg by mouth daily before breakfast.   traMADol 50 MG tablet Commonly known as: ULTRAM Take 1 tablet (50 mg total) by mouth every 6 (six) hours as needed (mild pain).        Follow Up Appointments:  Follow-up Information     Kerrin Elspeth BROCKS, MD Follow up.   Specialty: Cardiothoracic Surgery Why: Please see your discharge paperwork for details of follow-up appointment with surgeon.  On the date you are scheduled to see Dr. Kerrin obtain a chest x-ray 1/2-hour prior to his appointment.  This will be done on the second floor of the same office complex Contact information: [address] [place] [address] [phone]                 Signed: Kyla HERO ZimmermanPA-C 03/03/2024, 9:15 AM

## 2024-03-01 NOTE — Anesthesia Procedure Notes (Addendum)
 Arterial Line Insertion Start/End10/07/2023 9:40 AM, 03/01/2024 9:50 AM Performed by: Harrold Macintosh, CRNA, CRNA  Patient location: Pre-op. Preanesthetic checklist: patient identified, IV checked, site marked, risks and benefits discussed, surgical consent, monitors and equipment checked, pre-op evaluation, timeout performed and anesthesia consent Lidocaine 1% used for infiltration Right, radial was placed Catheter size: 20 G Hand hygiene performed  and maximum sterile barriers used  Allen's test indicative of satisfactory collateral circulation Attempts: 1 Procedure performed using ultrasound to evaluate access site. Ultrasound Notes:relevant anatomy identified, ultrasound used to visualize needle entry, vessel patent under ultrasound and image(s) printed for medical record. Following insertion, dressing applied and Biopatch. Post procedure assessment: normal and unchanged  Patient tolerated the procedure well with no immediate complications.

## 2024-03-01 NOTE — Interval H&P Note (Signed)
 History and Physical Interval Note: No interval change, myasthenia symptoms stable on mestinon   03/01/2024 10:53 AM  Chelsey Thompson  has presented today for surgery, with the diagnosis of THYMOMA.  The various methods of treatment have been discussed with the patient and family. After consideration of risks, benefits and other options for treatment, the patient has consented to  Procedure(s): THYMECTOMY, ROBOT-ASSISTED (Right) as a surgical intervention.  The patient's history has been reviewed, patient examined, no change in status, stable for surgery.  I have reviewed the patient's chart and labs.  Questions were answered to the patient's satisfaction.     [name]

## 2024-03-02 ENCOUNTER — Inpatient Hospital Stay (HOSPITAL_COMMUNITY)

## 2024-03-02 LAB — CBC
HCT: 33.1 % — ABNORMAL LOW (ref 36.0–46.0)
Hemoglobin: 11.3 g/dL — ABNORMAL LOW (ref 12.0–15.0)
MCH: 31.4 pg (ref 26.0–34.0)
MCHC: 34.1 g/dL (ref 30.0–36.0)
MCV: 91.9 fL (ref 80.0–100.0)
Platelets: 180 K/uL (ref 150–400)
RBC: 3.6 MIL/uL — ABNORMAL LOW (ref 3.87–5.11)
RDW: 12.9 % (ref 11.5–15.5)
WBC: 8.9 K/uL (ref 4.0–10.5)
nRBC: 0 % (ref 0.0–0.2)

## 2024-03-02 LAB — BASIC METABOLIC PANEL WITH GFR
Anion gap: 9 (ref 5–15)
BUN: 9 mg/dL (ref 8–23)
CO2: 22 mmol/L (ref 22–32)
Calcium: 7.9 mg/dL — ABNORMAL LOW (ref 8.9–10.3)
Chloride: 102 mmol/L (ref 98–111)
Creatinine, Ser: 0.71 mg/dL (ref 0.44–1.00)
GFR, Estimated: >60 mL/min (ref >60)
Glucose, Bld: 120 mg/dL — ABNORMAL HIGH (ref 70–99)
Potassium: 3 mmol/L — ABNORMAL LOW (ref 3.5–5.1)
Sodium: 133 mmol/L — ABNORMAL LOW (ref 135–145)

## 2024-03-02 MED ORDER — POTASSIUM CHLORIDE ER 10 MEQ PO TBCR
40.0000 meq | EXTENDED_RELEASE_TABLET | Freq: Two times a day (BID) | ORAL | Status: AC
  Administered 2024-03-02 (×2): 40 meq via ORAL
  Filled 2024-03-02 (×4): qty 4

## 2024-03-02 NOTE — Plan of Care (Signed)
   Problem: Education: Goal: Knowledge of General Education information will improve Description Including pain rating scale, medication(s)/side effects and non-pharmacologic comfort measures Outcome: Progressing

## 2024-03-02 NOTE — Progress Notes (Signed)
 1 Day Post-Op Procedure(s) (LRB): THYMECTOMY, ROBOT-ASSISTED (Right) Subjective: Some incisional pain, no nausea  Objective: Vital signs in last 24 hours: Temp:  [97.6 F (36.4 C)-99 F (37.2 C)] 98.2 F (36.8 C) (10/04 0700) Pulse Rate:  [55-74] 56 (10/04 0700) Cardiac Rhythm: Sinus bradycardia (10/04 0700) Resp:  [11-20] 14 (10/04 0700) BP: (139-174)/(70-99) 156/80 (10/04 0700) SpO2:  [91 %-96 %] 95 % (10/04 0700) Arterial Line BP: (130-161)/(62-77) 130/62 (10/04 0401) Weight:  [92 kg-98.2 kg] 98.2 kg (10/03 1836)  Hemodynamic parameters for last 24 hours:    Intake/Output from previous day: 10/03 0701 - 10/04 0700 In: 2884.8 [P.O.:1080; I.V.:1312.5; IV Piggyback:492.3] Out: 1045 [Urine:850; Blood:25; Chest Tube:170] Intake/Output this shift: No intake/output data recorded.  General appearance: alert, cooperative, and no distress Neurologic: intact Heart: regular rate and rhythm Lungs: diminished breath sounds bibasilar No air leak  Lab Results: Recent Labs    03/01/24 1347 03/02/24 0500  WBC  --  8.9  HGB 12.2 11.3*  HCT 36.0 33.1*  PLT  --  180   BMET:  Recent Labs    03/01/24 0943 03/01/24 1217 03/01/24 1347 03/02/24 0500  NA 137   < > 137 133*  K 3.5   < > 3.1* 3.0*  CL 105  --   --  102  CO2  --   --   --  22  GLUCOSE 86  --   --  120*  BUN 15  --   --  9  CREATININE 0.80  --   --  0.71  CALCIUM  --   --   --  7.9*   < > = values in this interval not displayed.    PT/INR: No results for input(s): LABPROT, INR in the last 72 hours. ABG    Component Value Date/Time   PHART 7.354 03/01/2024 1347   HCO3 25.6 03/01/2024 1347   TCO2 27 03/01/2024 1347   ACIDBASEDEF 2.0 03/01/2024 1300   O2SAT 100 03/01/2024 1347   CBG (last 3)  No results for input(s): GLUCAP in the last 72 hours.  Assessment/Plan: S/P Procedure(s) (LRB): THYMECTOMY, ROBOT-ASSISTED (Right) POD # 1- looks great NEURO- left ptosis improved from yesterday in  PACU  Continue mestinon  CV-  dc A line  BP has been a little high- monitor RESP- sat ok on 2L [place]  Atelectasis on CXR  IS RENAL- creatinine normal  Hypokalemia- replete K ENDO- CBG 120 this AM GI- tolerated clears - advance to regular diet SCD + enoxaparin Anemia- secondary to ABL- mild, expected, monitor Ambulate    LOS: 1 day    Chelsey Thompson 03/02/2024

## 2024-03-02 NOTE — Progress Notes (Signed)
 Pt ambulated halls with RN. Tolerated ambulating well with no complaints.

## 2024-03-03 ENCOUNTER — Inpatient Hospital Stay (HOSPITAL_COMMUNITY)

## 2024-03-03 DIAGNOSIS — Z9889 Other specified postprocedural states: Secondary | ICD-10-CM

## 2024-03-03 LAB — COMPREHENSIVE METABOLIC PANEL WITH GFR
ALT: 7 U/L (ref 0–44)
AST: 18 U/L (ref 15–41)
Albumin: 3 g/dL — ABNORMAL LOW (ref 3.5–5.0)
Alkaline Phosphatase: 40 U/L (ref 38–126)
Anion gap: 8 (ref 5–15)
BUN: 10 mg/dL (ref 8–23)
CO2: 24 mmol/L (ref 22–32)
Calcium: 8.2 mg/dL — ABNORMAL LOW (ref 8.9–10.3)
Chloride: 100 mmol/L (ref 98–111)
Creatinine, Ser: 0.72 mg/dL (ref 0.44–1.00)
GFR, Estimated: >60 mL/min (ref >60)
Glucose, Bld: 109 mg/dL — ABNORMAL HIGH (ref 70–99)
Potassium: 3.9 mmol/L (ref 3.5–5.1)
Sodium: 132 mmol/L — ABNORMAL LOW (ref 135–145)
Total Bilirubin: 0.8 mg/dL (ref 0.0–1.2)
Total Protein: 5.1 g/dL — ABNORMAL LOW (ref 6.5–8.1)

## 2024-03-03 LAB — CBC
HCT: 33.8 % — ABNORMAL LOW (ref 36.0–46.0)
Hemoglobin: 11.4 g/dL — ABNORMAL LOW (ref 12.0–15.0)
MCH: 31.5 pg (ref 26.0–34.0)
MCHC: 33.7 g/dL (ref 30.0–36.0)
MCV: 93.4 fL (ref 80.0–100.0)
Platelets: 173 K/uL (ref 150–400)
RBC: 3.62 MIL/uL — ABNORMAL LOW (ref 3.87–5.11)
RDW: 12.9 % (ref 11.5–15.5)
WBC: 8.8 K/uL (ref 4.0–10.5)
nRBC: 0 % (ref 0.0–0.2)

## 2024-03-03 MED ORDER — TRAMADOL HCL 50 MG PO TABS: 50.0000 mg | ORAL_TABLET | Freq: Four times a day (QID) | ORAL | 0 refills | Status: DC | PRN

## 2024-03-03 MED ORDER — GABAPENTIN 300 MG PO CAPS: 300.0000 mg | ORAL_CAPSULE | Freq: Two times a day (BID) | ORAL | 0 refills | Status: DC

## 2024-03-03 MED ORDER — GABAPENTIN 300 MG PO CAPS
300.0000 mg | ORAL_CAPSULE | Freq: Two times a day (BID) | ORAL | 0 refills | Status: DC
Start: 2024-03-03 — End: 2024-03-03

## 2024-03-03 NOTE — Anesthesia Postprocedure Evaluation (Signed)
 Anesthesia Post Note  Patient: Chelsey Thompson  Procedure(s) Performed: THYMECTOMY, ROBOT-ASSISTED (Right)     Patient location during evaluation: PACU Anesthesia Type: General Level of consciousness: awake Pain management: pain level controlled Vital Signs Assessment: post-procedure vital signs reviewed and stable Respiratory status: spontaneous breathing Cardiovascular status: blood pressure returned to baseline Postop Assessment: no apparent nausea or vomiting and adequate PO intake Anesthetic complications: no   No notable events documented.                Lauraine KATHEE [place]

## 2024-03-03 NOTE — Care Management (Signed)
  Transition of Care [facility]) Screening Note   Patient Details  Name: Chelsey Thompson Date of Birth: [birth year]   Transition of Care [facility]) CM/SW Contact:    Corean JAYSON Canary, RN Phone Number: 03/03/2024, 10:11 AM    Transition of Care Department [facility]) has reviewed patient and no TOC needs have been identified at this time. We will continue to monitor patient advancement through interdisciplinary progression rounds. If new patient transition needs arise, please place a TOC consult.

## 2024-03-03 NOTE — Progress Notes (Signed)
 2 Days Post-Op Procedure(s) (LRB): THYMECTOMY, ROBOT-ASSISTED (Right) Subjective: No complaints, mild pain with cough  Objective: Vital signs in last 24 hours: Temp:  [98 F (36.7 C)-98.7 F (37.1 C)] 98 F (36.7 C) (10/05 0810) Pulse Rate:  [65-77] 74 (10/05 0810) Cardiac Rhythm: Normal sinus rhythm (10/05 0700) Resp:  [17-20] 19 (10/05 0810) BP: (99-155)/(59-70) 99/66 (10/05 0810) SpO2:  [90 %-97 %] 93 % (10/05 0810)  Hemodynamic parameters for last 24 hours:    Intake/Output from previous day: 10/04 0701 - 10/05 0700 In: 1080 [P.O.:1080] Out: -  Intake/Output this shift: Total I/O In: 240 [P.O.:240] Out: -   General appearance: alert, cooperative, and no distress Neurologic: minimal left ptosis, smile symmetric Heart: regular rate and rhythm Lungs: clear to auscultation bilaterally  Lab Results: Recent Labs    03/02/24 0500 03/03/24 0215  WBC 8.9 8.8  HGB 11.3* 11.4*  HCT 33.1* 33.8*  PLT 180 173   BMET:  Recent Labs    03/02/24 0500 03/03/24 0215  NA 133* 132*  K 3.0* 3.9  CL 102 100  CO2 22 24  GLUCOSE 120* 109*  BUN 9 10  CREATININE 0.71 0.72  CALCIUM 7.9* 8.2*    PT/INR: No results for input(s): LABPROT, INR in the last 72 hours. ABG    Component Value Date/Time   PHART 7.354 03/01/2024 1347   HCO3 25.6 03/01/2024 1347   TCO2 27 03/01/2024 1347   ACIDBASEDEF 2.0 03/01/2024 1300   O2SAT 100 03/01/2024 1347   CBG (last 3)  No results for input(s): GLUCAP in the last 72 hours.  Assessment/Plan: S/P Procedure(s) (LRB): THYMECTOMY, ROBOT-ASSISTED (Right) POD # 2 Looks great Will dc home today Path pending- findings c/w thymoma   LOS: 2 days    Chelsey Thompson 03/03/2024

## 2024-03-04 ENCOUNTER — Encounter (HOSPITAL_COMMUNITY): Payer: Self-pay | Admitting: Thoracic Surgery (Cardiothoracic Vascular Surgery)

## 2024-03-05 LAB — SURGICAL PATHOLOGY

## 2024-03-15 ENCOUNTER — Other Ambulatory Visit: Payer: Self-pay | Admitting: Thoracic Surgery (Cardiothoracic Vascular Surgery)

## 2024-03-15 DIAGNOSIS — J9859 Other diseases of mediastinum, not elsewhere classified: Secondary | ICD-10-CM

## 2024-03-18 ENCOUNTER — Other Ambulatory Visit (HOSPITAL_COMMUNITY)

## 2024-03-19 ENCOUNTER — Ambulatory Visit (HOSPITAL_COMMUNITY)
Admission: RE | Admit: 2024-03-19 | Discharge: 2024-03-19 | Disposition: A | Discharge status: Home / Self Care | Source: Ambulatory Visit | Attending: Cardiology | Admitting: Cardiology

## 2024-03-19 ENCOUNTER — Ambulatory Visit
Payer: Self-pay | Attending: Thoracic Surgery (Cardiothoracic Vascular Surgery) | Admitting: Thoracic Surgery (Cardiothoracic Vascular Surgery)

## 2024-03-19 VITALS — BP 130/79 | HR 65 | Resp 20 | Ht 66.0 in | Wt 207.0 lb

## 2024-03-19 DIAGNOSIS — J9859 Other diseases of mediastinum, not elsewhere classified: Secondary | ICD-10-CM | POA: Diagnosis present

## 2024-03-19 DIAGNOSIS — J9811 Atelectasis: Secondary | ICD-10-CM | POA: Diagnosis not present

## 2024-03-19 DIAGNOSIS — Z9889 Other specified postprocedural states: Secondary | ICD-10-CM

## 2024-03-19 NOTE — Progress Notes (Signed)
 [address]       [place] [address]             [phone]    HPI: Ms. Porte returns for follow-up after recent thymectomy.  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  She presented with fatigue and weight gain.  She had a CT for coronary calcium scoring which showed a 2.3 x 1.6 cm anterior mediastinal mass.  She then developed a left facial droop with ptosis and asymmetric smile.  Initially labeled Bell's palsy, but antibody showed findings consistent with myasthenia gravis.  She was started on Mestinon  with some improvement of her symptoms.  She underwent a robotic assisted thymectomy on 03/01/2024.  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  No invasion of surrounding structures and lymph nodes were all negative.  Postoperative course was uncomplicated and she went home on day 2.  She still has some incisional pain.  She is only taking Tylenol  once since leaving the hospital.  Is not taking any gabapentin or tramadol.  She still is on Mestinon .  Wants to return to work as soon as possible.  Past Medical History:  Diagnosis Date   Ductal carcinoma in situ (DCIS) of left breast 2008   s/p mastectomy, reconstruction   Fibromyalgia    Frequent headaches    HLD (hyperlipidemia)    Hypertension    Hypothyroidism    Myasthenia gravis (HCC)    PONV (postoperative nausea and vomiting)    nausea after breast reconstrucion surgery on the way home in car    Current Outpatient Medications  Medication Sig Dispense Refill   hydrochlorothiazide (HYDRODIURIL) 25 MG tablet Take 25 mg by mouth every evening.     pyridostigmine  (MESTINON ) 60 MG tablet Take 1 tablet at 7a, noon, and 5pm. (Patient taking differently: Take 60 mg by mouth in the morning and at bedtime.) 90 tablet 5   SYNTHROID 50  MCG tablet Take 50 mcg by mouth daily before breakfast.     No current facility-administered medications for this visit.    Physical Exam BP 130/79   Pulse 65   Resp 20   Ht 5' 6 (1.676 m)   Wt 207 lb (93.9 kg)   LMP 02/03/2015   SpO2 97% Comment: RA  BMI 33.78 kg/m  63 year old woman in no acute distress HEENT unremarkable, smile symmetric, no ptosis Lungs clear with breath sounds bilaterally Cardiac regular rate and rhythm Incisions healing well with some erythema at chest tube sutures.  Sutures were removed.  Diagnostic Tests: I personally reviewed her chest x-ray images.  No worrisome findings.  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  Type B2 thymoma-status post thymectomy.  Will need follow-up.  Will arrange for consultation with Dr. Davonna at the [facility].  Status post thymectomy-doing well.  She does have some incisional pain but is not taking any narcotics or gabapentin.  Is partly due to a mixup with the prescriptions.  Has only taken Tylenol  on 1 occasion about a week ago.  She can resume normal activities as tolerated.  She wants to return to work next week.  Pulmonary cleared her to do that but if she has any difficulties she can  let us  know we may need to extend her time.  Myasthenia gravis-ptosis and asymmetric smile have completely resolved at this point.  Likely mostly due to Mestinon  but hopefully with thymectomy her symptoms will resolve or at least be easier to manage.   Plan: May return to work on Monday, 03/25/2024 If she encounters any issues she will let us  know Continue Mestinon  and follow-up with Dr. Tobie of neurology Return in 6 weeks with PA and lateral chest x-ray  [name], MD [facility] [phone]

## 2024-03-21 ENCOUNTER — Inpatient Hospital Stay

## 2024-03-21 ENCOUNTER — Inpatient Hospital Stay: Attending: Oncology | Admitting: Oncology

## 2024-03-21 VITALS — BP 136/87 | HR 68 | Temp 97.6°F | Resp 18 | Ht 66.0 in | Wt 209.0 lb

## 2024-03-21 DIAGNOSIS — C37 Malignant neoplasm of thymus: Secondary | ICD-10-CM | POA: Insufficient documentation

## 2024-03-21 DIAGNOSIS — D4989 Neoplasm of unspecified behavior of other specified sites: Secondary | ICD-10-CM

## 2024-03-21 DIAGNOSIS — Z86000 Personal history of in-situ neoplasm of breast: Secondary | ICD-10-CM | POA: Diagnosis not present

## 2024-03-21 DIAGNOSIS — Z9012 Acquired absence of left breast and nipple: Secondary | ICD-10-CM | POA: Insufficient documentation

## 2024-03-21 NOTE — Patient Instructions (Addendum)
 [facility]  Discharge Instructions  You were seen and examined today by Dr. Davonna. Dr. Davonna is a medical oncologist, meaning that she specializes in the treatment of cancer diagnoses. Dr. Davonna discussed your past medical history, family history of cancers, and the events that led to you being here today.  You were referred to Dr. Davonna due to a new diagnosis of a Thymoma. A thymoma is a tumor that is typically not aggressive and typically, once removed, it doesn't often recur; though you will need to be monitored on a regular basis for 10 years.  Follow-up as scheduled.  Thank you for choosing  [facility] to provide your oncology and hematology care.   To afford each patient quality time with our provider, please arrive at least 15 minutes before your scheduled appointment time. You may need to reschedule your appointment if you arrive late (10 or more minutes). Arriving late affects you and other patients whose appointments are after yours.  Also, if you miss three or more appointments without notifying the office, you may be dismissed from the clinic at the provider's discretion.    Again, thank you for choosing [facility].  Our hope is that these requests will decrease the amount of time that you wait before being seen by our physicians.   If you have a lab appointment with the Cancer Center - please note that after April 8th, all labs will be drawn in the cancer center.  You do not have to check in or [place] with the main entrance as you have in the past but will complete your check-in at the cancer center.            _____________________________________________________________  Should you have questions after your visit to [facility], please contact our office at [phone] and follow the prompts.  Our office hours are 8:00 a.m. to 4:30 p.m. Monday - Thursday and 8:00 a.m. to 2:30 p.m. Friday.   Please note that voicemails left after 4:00 p.m. may not be returned until the following business day.  We are closed weekends and all major holidays.  You do have access to a nurse 24-7, just call the main number to the clinic [phone] and do not press any options, hold on the line and a nurse will answer the phone.    For prescription refill requests, have your pharmacy contact our office and allow 72 hours.    Masks are no longer required in the cancer centers. If you would like for your care team to wear a mask while they are taking care of you, please let them know. You may have one support person who is at least 61 years old accompany you for your appointments.

## 2024-03-21 NOTE — Progress Notes (Signed)
 Hematology-Oncology Clinic Note  Chelsey Pagan, NP   Reason for Referral: Thymoma, type B2  Oncology History: I have reviewed her chart and materials related to her cancer extensively and collaborated history with the patient. Summary of oncologic history is as follows:  Diagnosis: Stage I (pT1a, pN0) Thymoma, type B2  -Presentation: Cough, Fatigue, left sided facial droop -11/01/2023: CT chest: 2.2 cm soft tissue lesion along the right anterior mediastinum, raising concern for an anterior mediastinal mass such as thymoma. Consider thoracic surgery consultation for further evaluation. Status post left mastectomy. No evidence of recurrent or metastatic disease. -03/01/2024: Thymectomy. (Dr.Hendrickson) Pathology: Thymoma, type B2. Tumor measure 4.8 cm. Negative margins. Seven of seven lymph nodes negative for malignancy (0/7). Staging:  pT1a, pN0    History of Presenting Illness: Chelsey Thompson 63 y.o. female is referred by Chelsey Elspeth BROCKS, MD for thymoma.   Patient has a medical history of left breast DCIS (s/p mastectomy and reconstruction) in 2008, hypertension, hyperlipidemia, hypothyroidism, and recently diagnosed myasthenia gravis.    Chelsey Thompson states her presentation symptoms included a dry cough and throat clearing beginning on 05/24/2023, which as not improved after surgery. She has taken allergy medicine, Flonase, and cetirizine in the past to treat symptoms, which have not improved. Her cough does not impact her daily activities and does not worsen when lying down. She denies any appetite loss, weight loss, weakness, tremors, or spasms.   Chelsey Thompson is aware of her thymoma diagnosis and understands why she is being seen today. We discussed remaining under observation for 10 years with appropriate scans.   She lives in [place]  with her husband and 2 sons. 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 She is not an active smoker and has no ETOH use.   Medical  History: Past Medical History:  Diagnosis Date   Ductal carcinoma in situ (DCIS) of left breast 2008   s/p mastectomy, reconstruction   Fibromyalgia    Frequent headaches    HLD (hyperlipidemia)    Hypertension    Hypothyroidism    Myasthenia gravis (HCC)    PONV (postoperative nausea and vomiting)    nausea after breast reconstrucion surgery on the way home in car    Surgical history: Past Surgical History:  Procedure Laterality Date   BREAST SURGERY Left    Cancer and implant   MASTECTOMY Left    2008   REDUCTION MAMMAPLASTY Right    THYMECTOMY, ROBOT-ASSISTED Right 03/01/2024   Procedure: THYMECTOMY, ROBOT-ASSISTED;  Surgeon: Chelsey Elspeth BROCKS, MD;  Location: [facility] OR;  Service: Thoracic;  Laterality: Right;     Allergies:  is allergic to sulfa antibiotics.  Medications:  Current Outpatient Medications  Medication Sig Dispense Refill   hydrochlorothiazide (HYDRODIURIL) 25 MG tablet Take 25 mg by mouth every evening.     pyridostigmine  (MESTINON ) 60 MG tablet Take 1 tablet at 7a, noon, and 5pm. (Patient taking differently: Take 60 mg by mouth in the morning and at bedtime.) 90 tablet 5   SYNTHROID 25 MCG tablet Take 25 mcg by mouth every morning.     No current facility-administered medications for this visit.    Review of Systems: Constitutional: Denies fevers, chills or abnormal night sweats Eyes: Denies blurriness of vision, double vision or watery eyes Ears, nose, mouth, throat, and face: Denies mucositis or sore throat Respiratory: Denies cough, dyspnea or wheezes Cardiovascular: Denies palpitation, chest discomfort or lower extremity swelling Gastrointestinal:  Denies nausea, heartburn or change in bowel habits Skin: Denies  abnormal skin rashes Lymphatics: Denies new lymphadenopathy or easy bruising Neurological:Denies numbness, tingling or new weaknesses Behavioral/Psych: Mood is stable, no new changes  All other systems were reviewed with the patient and  are negative.  Physical Examination: ECOG PERFORMANCE STATUS: 0 - Asymptomatic  Vitals:   03/21/24 1114  BP: 136/87  Pulse: 68  Resp: 18  Temp: 97.6 F (36.4 C)  SpO2: 97%   Filed Weights   03/21/24 1114  Weight: 209 lb (94.8 kg)    GENERAL:alert, no distress and comfortable SKIN: skin color, texture, turgor are normal, no rashes or significant lesions NECK: supple, thyroid  normal size, non-tender, without nodularity LYMPH:  no palpable lymphadenopathy in the cervical, axillary or inguinal LUNGS: clear to auscultation and percussion with normal breathing effort HEART: regular rate & rhythm and no murmurs and no lower extremity edema ABDOMEN:abdomen soft, non-tender and normal bowel sounds Musculoskeletal:no cyanosis of digits and no clubbing  PSYCH: alert & oriented x 3 with fluent speech  Laboratory Data: I have reviewed the data as listed Lab Results  Component Value Date   WBC 8.8 03/03/2024   HGB 11.4 (L) 03/03/2024   HCT 33.8 (L) 03/03/2024   MCV 93.4 03/03/2024   PLT 173 03/03/2024   Recent Labs    02/26/24 1500 03/01/24 0943 03/01/24 1217 03/01/24 1347 03/02/24 0500 03/03/24 0215  NA 131* 137   < > 137 133* 132*  K 3.2* 3.5   < > 3.1* 3.0* 3.9  CL 94* 105  --   --  102 100  CO2 27  --   --   --  22 24  GLUCOSE 90 86  --   --  120* 109*  BUN 7* 15  --   --  9 10  CREATININE 0.64 0.80  --   --  0.71 0.72  CALCIUM 9.2  --   --   --  7.9* 8.2*  GFRNONAA >60  --   --   --  >60 >60  PROT 6.8  --   --   --   --  5.1*  ALBUMIN 4.1  --   --   --   --  3.0*  AST 19  --   --   --   --  18  ALT 12  --   --   --   --  7  ALKPHOS 51  --   --   --   --  40  BILITOT 1.2  --   --   --   --  0.8   < > = values in this interval not displayed.    Radiographic Studies: I have personally reviewed the radiological images as listed and agreed with the findings in the report.  CLINICAL DATA:  Anterior mediastinal lesion on CT   EXAM: CT CHEST WITH CONTRAST    TECHNIQUE: Multidetector CT imaging of the chest was performed during intravenous contrast administration.   RADIATION DOSE REDUCTION: This exam was performed according to the departmental dose-optimization program which includes automated exposure control, adjustment of the mA and/or kV according to patient size and/or use of iterative reconstruction technique.   CONTRAST:  100mL ISOVUE -300 IOPAMIDOL  (ISOVUE -300) INJECTION 61%   COMPARISON:  Partial comparison to cardiac CT dated 09/27/2023   FINDINGS: Cardiovascular: Heart is normal in size.  No pericardial effusion.   No evidence of thoracic aortic aneurysm. Mild atherosclerotic calcifications of the aortic arch.   Mild three-vessel coronary atherosclerosis.   Mediastinum/Nodes: No suspicious  mediastinal lymphadenopathy.   1.7 x 2.2 cm soft tissue lesion along the right anterior mediastinum with peripheral calcification (image 58), raising concern for an anterior mediastinal mass such as thymoma.   Visualized thyroid  is unremarkable.   Lungs/Pleura: No suspicious pulmonary nodules.   Mild scarring in the bilateral lower lobes and medial right middle lobe.   No focal consolidation.   Mild centrilobular emphysematous changes, upper lung predominant.   Upper Abdomen: Visualized upper abdomen is grossly unremarkable.   Musculoskeletal: Status post left mastectomy with reconstruction. Mild midthoracic dextroscoliosis.   IMPRESSION: 2.2 cm soft tissue lesion along the right anterior mediastinum, raising concern for an anterior mediastinal mass such as thymoma. Consider thoracic surgery consultation for further evaluation.   Status post left mastectomy. No evidence of recurrent or metastatic disease.   Aortic Atherosclerosis (ICD10-I70.0) and Emphysema (ICD10-J43.9).     Electronically Signed   By: Pinkie Pebbles M.D.   On: 11/02/2023 23:02  ASSESSMENT & PLAN:  Patient is a 63 y.o. female presenting for  thymoma  Thymoma Thymoma type B2 s/p resection with negative margins.  No capsular invasion present. R0 Resection  -Considering patient had R0 resection with no high risk features, can do surveillance per NCCN guidelines -We reviewed the pathology results together.  Discussed prognosis in detail. - Chest CT with contrast every 6 to 12 months for 2 years and then annually until year 10 - Patient's last chest CT was from 10/2023.  Will repeat in 3 months.  Return to clinic in 3 months with CT scan to discuss results and further management  History of DCIS Left breast DCIS status post mastectomy with SLNB in 2008.  ER, PR negative.  Patient currently getting yearly mammograms.   Orders Placed This Encounter  Procedures   CT Chest W Contrast    Standing Status:   Future    Expected Date:   06/21/2024    Expiration Date:   03/21/2025    If indicated for the ordered procedure, I authorize the administration of contrast media per Radiology protocol:   Yes    Does the patient have a contrast media/X-ray dye allergy?:   No    Preferred imaging location?:   [facility]    Release to patient:   Immediate [1]   CBC with Differential    Standing Status:   Future    Expected Date:   06/21/2024    Expiration Date:   09/19/2024   Comprehensive metabolic panel    Standing Status:   Future    Expected Date:   06/21/2024    Expiration Date:   09/19/2024    The total time spent in the appointment was 60 minutes encounter with patients including review of chart and various tests results, discussions about plan of care and coordination of care plan   All questions were answered. The patient knows to call the clinic with any problems, questions or concerns. No barriers to learning was detected.  Mickiel Dry, MD 10/23/202511:12 PM

## 2024-04-30 ENCOUNTER — Ambulatory Visit: Admitting: Thoracic Surgery (Cardiothoracic Vascular Surgery)

## 2024-05-27 ENCOUNTER — Telehealth: Payer: Self-pay | Admitting: Cardiovascular Disease

## 2024-05-27 NOTE — Telephone Encounter (Signed)
 Patient is requesting referral to [facility] in [place].   Informed her this would have to be ordered as a different type of referral (dietician vs. Medical weight management).   Will forward to Dr. Court to review. Informed patient Dr. Court is out of the office until next week and we may not have a response until that time. Patient verbalized understanding.

## 2024-05-27 NOTE — Telephone Encounter (Signed)
 Patient wants her referral to AMBULATORY REFERRAL TO MEDICAL WEIGHT MANAGEMENT be sent to [facility] in [place].

## 2024-05-28 NOTE — Telephone Encounter (Signed)
 Referral to [facility] at [place] ordered per patient request, Ok'ed by Dr. Court.

## 2024-06-05 ENCOUNTER — Encounter: Payer: Self-pay | Admitting: Cardiovascular Disease

## 2024-06-20 ENCOUNTER — Encounter: Attending: Cardiovascular Disease | Admitting: Nutrition

## 2024-06-20 ENCOUNTER — Encounter: Payer: Self-pay | Admitting: Nutrition

## 2024-06-20 NOTE — Progress Notes (Signed)
 Medical Nutrition Therapy  Appointment Start time:  1530  Appointment End time:  1700  Primary concerns today: Obesity  Referral diagnosis: E66.9 Preferred learning style: Read Learning readiness:  Ready    NUTRITION ASSESSMENT  64 yr old white female referred for obesity. I have tried a lot of diets. I am trying the 14 day diet right now. H/o breast cancer, HTN, HLD,and Hypothryoididma nd Myasthenia Gravis and obesity. Has lost about 9 lbs. Current diet restricts fruit and breads and other carbohydrates. Not physically active outside her job. Does do some walking on her job.  Clinical Wt Readings from Last 3 Encounters:  06/20/24 200 lb 9.6 oz (91 kg)  03/21/24 209 lb (94.8 kg)  03/19/24 207 lb (93.9 kg)   Ht Readings from Last 3 Encounters:  06/20/24 5' 6 (1.676 m)  03/21/24 5' 6 (1.676 m)  03/19/24 5' 6 (1.676 m)   Body mass index is 32.38 kg/m. @[facility] @ Facility age limit for growth %iles is 20 years. Facility age limit for growth %iles is 20 years.   Medical Hx  Past Medical History:  Diagnosis Date   Ductal carcinoma in situ (DCIS) of left breast 2008   s/p mastectomy, reconstruction   Fibromyalgia    Frequent headaches    HLD (hyperlipidemia)    Hypertension    Hypothyroidism    Myasthenia gravis (HCC)    PONV (postoperative nausea and vomiting)    nausea after breast reconstrucion surgery on the way home in car    Medications: Medications Ordered Prior to Encounter[1]  Labs:     Latest Ref Rng & Units 03/03/2024    2:15 AM 03/02/2024    5:00 AM 03/01/2024    1:47 PM  CMP  Glucose 70 - 99 mg/dL 890  879    BUN 8 - 23 mg/dL 10  9    Creatinine 9.55 - 1.00 mg/dL 9.27  9.28    Sodium 864 - 145 mmol/L 132  133  137   Potassium 3.5 - 5.1 mmol/L 3.9  3.0  3.1   Chloride 98 - 111 mmol/L 100  102    CO2 22 - 32 mmol/L 24  22    Calcium 8.9 - 10.3 mg/dL 8.2  7.9    Total Protein 6.5 - 8.1 g/dL 5.1     Total Bilirubin 0.0 - 1.2 mg/dL 0.8     Alkaline  Phos 38 - 126 U/L 40     AST 15 - 41 U/L 18     ALT 0 - 44 U/L 7      Lipid Panel  No results found for: CHOL, TRIG, HDL, CHOLHDL, VLDL, LDLCALC, LDLDIRECT, LABVLDL No results found for: YHAJ8R  Notable Signs/Symptoms:   Lifestyle & Dietary Hx Lives with her 2 sons  Estimated daily fluid intake: 64 oz Supplements:  Sleep: 5-6 hrs Stress / self-care:  Current average weekly physical activity: ADL walks on her job.  24-Hr Dietary Recall First Meal: Herbal tea, unsweet almond milk,  Snack: 9 am greek yogurt and nuts unsalted 2 tbsp Second Meal: roast chicken breast strips, 20 grapes, water Snack:  Third Meal:  4 cups Kale or collards, chicken breast, or ground turkey, sweet potatoes plain, water Beverages: water  Estimated Energy Needs Calories: 1200 Carbohydrate: 135g Protein: 90g Fat: 33g   NUTRITION DIAGNOSIS  NB-1.1 Food and nutrition-related knowledge deficit As related to Overweight.  As evidenced by BMI.32.   NUTRITION INTERVENTION  Nutrition education (E-1) on the following topics:  Lifestyle Medicine  - Whole Food, Plant Predominant Nutrition is highly recommended: Eat Plenty of vegetables, Mushrooms, fruits, Legumes, Whole Grains, Nuts, seeds in lieu of processed meats, processed snacks/pastries red meat, poultry, eggs.    -It is better to avoid simple carbohydrates including: Cakes, Sweet Desserts, Ice Cream, Soda (diet and regular), Sweet Tea, Candies, Chips, Cookies, Store Bought Juices, Alcohol in Excess of  1-2 drinks a day, Lemonade,  Artificial Sweeteners, Doughnuts, Coffee Creamers, Sugar-free Products, etc, etc.  This is not a complete list.....  Exercise: If you are able: 30 -60 minutes a day ,4 days a week, or 150 minutes a week.  The longer the better.  Combine stretch, strength, and aerobic activities.  If you were told in the past that you have high risk for cardiovascular diseases, you may seek evaluation by your heart doctor  prior to initiating moderate to intense exercise programs.  Emotional Eating, food triggers, fad diets, unhealthy weight loss tips and need for increase energy expenditure, nutrient dense foods vs calorie restriction.  Benefits of a whole plant based diet lifestyle for history of breast cancer.  Handouts Provided Include  Lifestyle Medicine handouts Emotional Eating   Learning Style & Readiness for Change Teaching method utilized: Visual & Auditory  Demonstrated degree of understanding via: Teach Back  Barriers to learning/adherence to lifestyle change: none  Goals Established by Pt Eat three balanced meals at times discussed Focus on nutrient dense foods with all meals vs restricting a food group Exercise 60 minutes 3 times per week outside your job for desired weight loss and building muscle Focus on emotional eating cues vs true hunger. Lose 1 lb per week focused on whole foods that are nutrient dense foods.   MONITORING & EVALUATION Dietary intake, weekly physical activity, and weight in 1 month.  Next Steps  Patient is to work on eating more whole plant based foods for all meals and not restricting fruits or complex CHO's for health benefits..     [1]  Current Outpatient Medications on File Prior to Visit  Medication Sig Dispense Refill   losartan-hydrochlorothiazide  (HYZAAR) 50-12.5 MG tablet Take 1 tablet by mouth daily.     SYNTHROID  25 MCG tablet Take 25 mcg by mouth every morning.     hydrochlorothiazide  (HYDRODIURIL ) 25 MG tablet Take 25 mg by mouth every evening. (Patient not taking: Reported on 06/20/2024)     No current facility-administered medications on file prior to visit.

## 2024-06-21 ENCOUNTER — Other Ambulatory Visit: Payer: Self-pay | Admitting: *Deleted

## 2024-06-21 ENCOUNTER — Inpatient Hospital Stay

## 2024-06-21 ENCOUNTER — Ambulatory Visit (HOSPITAL_COMMUNITY)

## 2024-06-21 DIAGNOSIS — D4989 Neoplasm of unspecified behavior of other specified sites: Secondary | ICD-10-CM

## 2024-06-21 NOTE — Progress Notes (Signed)
 CT chest orders expired, new order entered.

## 2024-06-27 ENCOUNTER — Inpatient Hospital Stay: Admitting: Oncology

## 2024-06-29 ENCOUNTER — Other Ambulatory Visit: Payer: Self-pay | Admitting: Neurology

## 2024-07-02 ENCOUNTER — Encounter: Payer: Self-pay | Admitting: Neurology

## 2024-07-02 ENCOUNTER — Encounter: Payer: Self-pay | Admitting: Nutrition

## 2024-07-02 ENCOUNTER — Ambulatory Visit (INDEPENDENT_AMBULATORY_CARE_PROVIDER_SITE_OTHER): Admitting: Neurology

## 2024-07-02 VITALS — BP 133/86 | HR 76 | Ht 66.0 in | Wt 198.0 lb

## 2024-07-02 DIAGNOSIS — G7001 Myasthenia gravis with (acute) exacerbation: Secondary | ICD-10-CM | POA: Diagnosis not present

## 2024-07-02 DIAGNOSIS — R413 Other amnesia: Secondary | ICD-10-CM | POA: Diagnosis not present

## 2024-07-02 NOTE — Patient Instructions (Signed)
 Take mestinon  60mg  twice daily at 9a and 4pm

## 2024-07-02 NOTE — Progress Notes (Signed)
 "   Follow-up Visit   Date: 07/02/2024    Chelsey Thompson MRN: [id number] DOB: 03/10/1961    Chelsey Thompson is a 64 y.o. right-handed Caucasian female with hypertension and hypothyroidism returning to the clinic for follow-up of seropositive myasthenia gravis s/p thymectomy.  The patient was accompanied to the clinic by self.   IMPRESSION/PLAN: Assessment & Plan Seropositive myasthenia gravis, s/p thymectomy for thymoma (2025).   Myasthenia gravis well-controlled on pyridostigmine .  She does not have any evidence of disease manifestation on exam.  - Continued pyridostigmine  60 mg orally twice daily, adjusted to 9 am and 4 pm. - Sent prescription for Mestinon  60 mg to [facility] for a 90-day supply.  Cognitive changes described as being forgetful and misplacing things.  Neurological exam does not show any focal abnormalities.    - CT head declined - Check vitamin B12 and TSH   Return to clinic in 6 months  --------------------------------------------- History of present illness: For the past several months she has been having intermittent double vision described as images side-by-side.  Closing one eye would resolve her double vision.  In May, she began having difficulty with solids.  No choking spells.  In June, she went to the ER with left ptosis and was treated with valcyclovir and prednisone  for suspected Bell's palsy, which improved her symptoms for 2 weeks.  After finishing her prednisone , her left eye became droopy again.     She reports gaining 40lb since November and was having chronic dry cough which prompted further evaluation and ultimately had CT chest which shows mediastinal mass most suggestive of a thymoma.  She saw CTS for this who ordered acetylcholine receptor antibodies which returned positive.  She is here for further evaluation and management of myasthenia gravis.    She works as a contractor.  She lives at home.  Nonsmoker and does not  drink alcohol.  UPDATE 07/02/2024:   Discussed the use of AI scribe software for clinical note transcription with the patient, who gave verbal consent to proceed.  History of Present Illness  INTERVAL HISTORY Since her last visit in July 2025, she underwent thymectomy for thymoma in October 2025.  She was initially prescribed Mestinon  60 mg three times daily but was inconsistently adherent. In January 2026, she began taking Mestinon  as prescribed and noted a marked increase in energy, which led her primary care physician to reduce the dose to twice daily due to concerns about elevated blood pressure. She currently takes Mestinon  60 mg with breakfast and dinner. She reports decreased energy since the dose reduction but is uncertain if this is related to medication, blood pressure, or personal stress. e.  She continues to experience persistent visual disturbances, described as inability to focus, moreso than double vision, and including dry eyes. She is legally blind in her left eye and unable to read fine details, though she can distinguish colors and shapes. She uses [employer] glasses for glare and screen use, with her prescription updated in January 2025. In May 2025, she noted a sudden decline in vision characterized by increased fuzziness and inability to focus, without true diplopia.   Since the weekend, she has felt extremely disoriented, with episodes of misplacing items and forgetfulness. She attributes some of these symptoms to a minor head injury sustained the day before the visit, when she was struck on the head by a rooster. She has a history of moderate concussion in 2018. She is experiencing significant personal stress unrelated  to work.   Medications:  Medications Ordered Prior to Encounter[1]  Allergies: Allergies[2]  Vital Signs:  BP 133/86   Pulse 76   Ht 5' 6 (1.676 m)   Wt 198 lb (89.8 kg)   LMP 02/03/2015   SpO2 99%   BMI 31.96 kg/m    Neurological Exam: MENTAL  STATUS including orientation to time, place, person, recent and remote memory, language, and fund of knowledge is normal.  Attention is scattered and thought process somewhat tangential. Speech is not dysarthric.  CRANIAL NERVES:  Pupils equal round and reactive to light.  Normal conjugate, extra-ocular eye movements in all directions of gaze.  No ptosis at baseline or with sustained upgaze.  Face is symmetric.  Orbicularis oculi and buccinator is 5/5.  MOTOR:  Motor strength is 5/5 in all extremities.  No atrophy, fasciculations or abnormal movements.  No pronator drift.  Tone is normal.    COORDINATION/GAIT:  Gait narrow based and stable.   Data: Labs 12/19/2023:  AChR binding 4.56*, AChR blocking  44*   CT chest w contrast 11/02/2023:  2.2 cm soft tissue lesion along the right anterior mediastinum, raising concern for an anterior mediastinal mass such as thymoma. Consider thoracic surgery consultation for further evaluation.   Status post left mastectomy. No evidence of recurrent or metastatic disease.   Aortic Atherosclerosis (ICD10-I70.0) and Emphysema (ICD10-J43.9).   Thank you for allowing me to participate in patient's care.  If I can answer any additional questions, I would be pleased to do so.    Sincerely,    Keland Peyton K. Tobie, DO      [1]  Current Outpatient Medications on File Prior to Visit  Medication Sig Dispense Refill   losartan-hydrochlorothiazide  (HYZAAR) 50-12.5 MG tablet Take 1 tablet by mouth daily.     pyridostigmine  (MESTINON ) 60 MG tablet TAKE 1 TABLET AT 7AM, NOON, AND 5PM. 90 tablet 1   SYNTHROID  25 MCG tablet Take 25 mcg by mouth every morning.     hydrochlorothiazide  (HYDRODIURIL ) 25 MG tablet Take 25 mg by mouth every evening. (Patient not taking: Reported on 07/02/2024)     No current facility-administered medications on file prior to visit.  [2]  Allergies Allergen Reactions   Sulfa Antibiotics Rash   "

## 2024-07-11 ENCOUNTER — Ambulatory Visit (HOSPITAL_COMMUNITY)

## 2024-07-11 ENCOUNTER — Inpatient Hospital Stay

## 2024-07-29 ENCOUNTER — Inpatient Hospital Stay: Admitting: Oncology

## 2024-07-31 ENCOUNTER — Ambulatory Visit: Payer: Self-pay | Admitting: Neurology

## 2024-08-15 ENCOUNTER — Encounter: Admitting: Nutrition

## 2024-12-25 ENCOUNTER — Ambulatory Visit: Payer: Self-pay | Admitting: Neurology
